# Patient Record
Sex: Female | Born: 1980 | Hispanic: Yes | Marital: Married | Smoking: Never smoker
Health system: Southern US, Community
[De-identification: ages and names within clinical notes are randomized; demographics above are authoritative.]

## PROBLEM LIST (undated history)

## (undated) HISTORY — PX: APPENDECTOMY: SHX54

---

## 2005-09-13 ENCOUNTER — Observation Stay: Payer: Self-pay | Admitting: Obstetrics & Gynecology

## 2006-10-12 ENCOUNTER — Emergency Department: Payer: Self-pay | Admitting: Emergency Medicine

## 2007-07-21 ENCOUNTER — Emergency Department: Payer: Self-pay | Admitting: Emergency Medicine

## 2008-09-03 ENCOUNTER — Emergency Department: Payer: Self-pay | Admitting: Internal Medicine

## 2010-06-14 ENCOUNTER — Emergency Department: Payer: Self-pay | Admitting: Internal Medicine

## 2011-03-13 ENCOUNTER — Emergency Department: Payer: Self-pay | Admitting: Emergency Medicine

## 2011-03-13 LAB — URINALYSIS, COMPLETE
Bacteria: NONE SEEN
Bilirubin,UR: NEGATIVE
Glucose,UR: NEGATIVE mg/dL (ref 0–75)
Nitrite: NEGATIVE
Ph: 6 (ref 4.5–8.0)
Specific Gravity: 1.003 (ref 1.003–1.030)
Squamous Epithelial: <1

## 2011-03-13 LAB — PREGNANCY, URINE: Pregnancy Test, Urine: NEGATIVE m[IU]/mL

## 2013-11-30 ENCOUNTER — Encounter: Payer: Self-pay | Admitting: Maternal & Fetal Medicine

## 2014-05-29 ENCOUNTER — Observation Stay
Admit: 2014-05-29 | Disposition: A | Discharge status: Home / Self Care | Payer: Self-pay | Attending: Obstetrics and Gynecology | Admitting: Obstetrics and Gynecology

## 2014-05-30 ENCOUNTER — Observation Stay
Admit: 2014-05-30 | Disposition: A | Discharge status: Home / Self Care | Payer: Self-pay | Attending: Obstetrics and Gynecology | Admitting: Obstetrics and Gynecology

## 2014-12-12 ENCOUNTER — Emergency Department
Admission: EM | Admit: 2014-12-12 | Discharge: 2014-12-12 | Disposition: A | Discharge status: Home / Self Care | Payer: Self-pay | Attending: Emergency Medicine | Admitting: Emergency Medicine

## 2014-12-12 ENCOUNTER — Emergency Department: Payer: Self-pay

## 2014-12-12 ENCOUNTER — Encounter: Payer: Self-pay | Admitting: *Deleted

## 2014-12-12 DIAGNOSIS — Z3202 Encounter for pregnancy test, result negative: Secondary | ICD-10-CM | POA: Insufficient documentation

## 2014-12-12 DIAGNOSIS — M5416 Radiculopathy, lumbar region: Secondary | ICD-10-CM | POA: Insufficient documentation

## 2014-12-12 DIAGNOSIS — M5410 Radiculopathy, site unspecified: Secondary | ICD-10-CM

## 2014-12-12 LAB — URINALYSIS COMPLETE WITH MICROSCOPIC (ARMC ONLY)
BACTERIA UA: NONE SEEN
Bilirubin Urine: NEGATIVE
Glucose, UA: NEGATIVE mg/dL
KETONES UR: NEGATIVE mg/dL
Leukocytes, UA: NEGATIVE
Nitrite: NEGATIVE
PH: 6 (ref 5.0–8.0)
PROTEIN: NEGATIVE mg/dL
RBC / HPF: NONE SEEN RBC/hpf (ref 0–5)
Specific Gravity, Urine: 1.004 — ABNORMAL LOW (ref 1.005–1.030)
Squamous Epithelial / LPF: NONE SEEN

## 2014-12-12 LAB — POCT PREGNANCY, URINE: PREG TEST UR: NEGATIVE

## 2014-12-12 MED ORDER — KETOROLAC TROMETHAMINE 30 MG/ML IJ SOLN
INTRAMUSCULAR | Status: AC
  Filled 2014-12-12: qty 1

## 2014-12-12 MED ORDER — TRAMADOL HCL 50 MG PO TABS: 50.0000 mg | ORAL_TABLET | Freq: Four times a day (QID) | ORAL | Status: AC | PRN

## 2014-12-12 MED ORDER — KETOROLAC TROMETHAMINE 30 MG/ML IJ SOLN
60.0000 mg | Freq: Once | INTRAMUSCULAR | Status: AC
  Administered 2014-12-12: 60 mg via INTRAMUSCULAR
  Filled 2014-12-12: qty 2

## 2014-12-12 MED ORDER — MELOXICAM 15 MG PO TABS: 15.0000 mg | ORAL_TABLET | Freq: Every day | ORAL | Status: DC

## 2014-12-12 NOTE — ED Provider Notes (Signed)
[facility] Emergency Department Provider Note  ____________________________________________  Time seen: Approximately 7:50 AM  I have reviewed the triage vital signs and the nursing notes.   HISTORY  Chief Complaint Back Pain    HPI Gabriela Carter is a 34 y.o. female presents to the emergency department complaining of low back pain. She states that she has had a history of 6 months with a back pain status post giving birth. She states that the epidural was placed incorrectly a first time and had to be replaced and since then she has had some chronic pain. She states over the last 24 hours the pain has significantly increased. Its midline. A chart. It radiates down her left leg. She denies any numbness or tingling or loss of function in lower leg. She denies any saddle anesthesia or bowel or bladder dysfunction.   History reviewed. No pertinent past medical history.  There are no active problems to display for this patient.   History reviewed. No pertinent past surgical history.  Current Outpatient Rx  Name  Route  Sig  Dispense  Refill  . meloxicam (MOBIC) 15 MG tablet   Oral   Take 1 tablet (15 mg total) by mouth daily.   30 tablet   0   . traMADol (ULTRAM) 50 MG tablet   Oral   Take 1 tablet (50 mg total) by mouth every 6 (six) hours as needed.   20 tablet   0     Allergies Percocet  History reviewed. No pertinent family history.  Social History Social History  Substance Use Topics  . Smoking status: Never Smoker   . Smokeless tobacco: None  . Alcohol Use: None    Review of Systems Constitutional: No fever/chills Eyes: No visual changes. ENT: No sore throat. Cardiovascular: Denies chest pain. Respiratory: Denies shortness of breath. Gastrointestinal: No abdominal pain.  No nausea, no vomiting.  No diarrhea.  No constipation. Genitourinary: Negative for dysuria. Musculoskeletal: Endorses lumbar back pain. Skin: Negative for  rash. Neurological: Negative for headaches, focal weakness or numbness.  10-point ROS otherwise negative.  ____________________________________________   PHYSICAL EXAM:  VITAL SIGNS: ED Triage Vitals  Enc Vitals Group     BP 12/12/14 0740 118/75 mmHg     Pulse Rate 12/12/14 0740 74     Resp 12/12/14 0740 18     Temp 12/12/14 0740 98.6 F (37 C)     Temp src --      SpO2 12/12/14 0740 98 %     Weight 12/12/14 0740 143 lb 4.8 oz (65 kg)     Height 12/12/14 0740 5\' 2"  (1.575 m)     Head Cir --      Peak Flow --      Pain Score 12/12/14 0747 10     Pain Loc --      Pain Edu? --      Excl. in GC? --     Constitutional: Alert and oriented. Well appearing and in no acute distress. Eyes: Conjunctivae are normal. PERRL. EOMI. Head: Atraumatic. Nose: No congestion/rhinnorhea. Mouth/Throat: Mucous membranes are moist.  Oropharynx non-erythematous. Neck: No stridor.  No cervical spine tenderness to palpation. Cardiovascular: Normal rate, regular rhythm. Grossly normal heart sounds.  Good peripheral circulation. Respiratory: Normal respiratory effort.  No retractions. Lungs CTAB. Gastrointestinal: Soft and nontender. No distention. No abdominal bruits. No CVA tenderness. Musculoskeletal: No lower extremity tenderness nor edema.  No joint effusions. No visible deformity or scoliosis to the lower back.  Patient is tender midline to palpation. No muscle spasms are noted in the paraspinal muscles. No tenderness to palpation over the paraspinal muscles. Positive straight leg raise left side. Neurologic:  Normal speech and language. No gross focal neurologic deficits are appreciated. No gait instability. Skin:  Skin is warm, dry and intact. No rash noted. Psychiatric: Mood and affect are normal. Speech and behavior are normal.  ____________________________________________   LABS (all labs ordered are listed, but only abnormal results are displayed)  Labs Reviewed  URINALYSIS  COMPLETEWITH MICROSCOPIC ([facility] ONLY) - Abnormal; Notable for the following:    Color, Urine STRAW (*)    APPearance CLEAR (*)    Specific Gravity, Urine 1.004 (*)    Hgb urine dipstick 1+ (*)    All other components within normal limits  POCT PREGNANCY, URINE   ____________________________________________  EKG   ____________________________________________  RADIOLOGY  Lumbar spine Impression: No acute bony abnormality ____________________________________________   PROCEDURES  Procedure(s) performed: None  Critical Care performed: No  ____________________________________________   INITIAL IMPRESSION / ASSESSMENT AND PLAN / ED COURSE  Pertinent labs & imaging results that were available during my care of the patient were reviewed by me and considered in my medical decision making (see chart for details).  The patient's history, symptoms, physical exam, and radiological findings were taken into consideration for diagnosis. Diagnosis is most consistent at this time with radicular pain/sciatica. Advised the patient and her husband of findings. Patient is to follow-up with orthopedics for further evaluation and treatment. Patient will be placed on meloxicam for symptomatic control. Patient verbalizes understanding of treatment plan and verbalizes compliance with same. ____________________________________________   FINAL CLINICAL IMPRESSION(S) / ED DIAGNOSES  Final diagnoses:  Radicular low back pain      Racheal Patches, PA-C 12/12/14 1610  Sharyn Creamer, MD 12/12/14 1600

## 2014-12-12 NOTE — Discharge Instructions (Signed)
Dolor Radicular (Radicular Pain) El dolor radicular est causado en la irritacin de las races nerviosas. Generalmente la causa es una degeneracin el un disco de la columna vertebral. Esto produce dolor que se siente en el brazo o la pierna. Entre otras causas del dolor radicular se incluyen:  Fracturas  Enfermedades cardacas  Cncer  Neuropatas (un estado anormal y a menudo degenerativo del sistema nervioso o nervios). Para el diagnstico le indicarn una tomografa computarizada o imgenes por resonancia magntica para determinar la causa primaria. Los nervios que comienzan en el cuello (races nerviosas) pueden ocasionar dolor radicular en la parte exterior del hombro y en el brazo. ste puede extenderse hacia el pulgar y los dedos. Los sntomas varan segn la raz nerviosa afectada. En la mayor parte de los casos mejora en gran medida con un tratamiento conservador. Para los problemas en el cuello le indicarn fisioterapia, un collar o una traccin cervical. El tratamiento puede durar varias semanas y se considerar la posibilidad de una ciruga si los sntomas no mejoran.  El tratamiento conservador tambin se recomienda para los casos de citica que causan dolor que se irradia desde la zona baja de la espalda o nalgas hacia la pierna o el pie. Generalmente hay una historia previa de problemas en la espalda. La mayora de los pacientes mejora completamente luego de 2 a 4 semanas de reposo en cama y otros tratamientos de apoyo. El reposo en cama reduce la presin en el disco considerablemente. El estar sentado no es una buena posicin, ya que aumenta la presin sobre el disco. Evite encorvarse, levantar mucho peso, permanecer sentado por mucho tiempo, y las actividades que puedan empeorar el problema. El los casos ms graves se realizar una traccin. La ciruga est reservada para los pacientes que no mejoran dentro de los primeros meses de tratamiento. Utilice los medicamentos de venta libre  o de prescripcin para el dolor, el malestar o la fiebre, segn se lo indique el profesional que lo asiste. Los narcticos y los relajantes musculares ayudan a calmar el dolor ms intenso y los espasmos, proporcionando una sedacin suave. Una terapia de fro o masajes tambin le brindarn alivio. No se recomienda la manipulacin de la columna vertebral. Esto puede aumentar el grado de protrusin del disco. Las inyecciones epidurales con esteroides son a menudo un tratamiento efectivo para el dolor radicular. Esas inyecciones colocan medicamentos al nervio espinal en el espacio entre la cubierta protectora de la mdula espinal y los huesos de la espalda (vertebrae). El profesional que lo asiste podr darle ms informacin acerca de las inyecciones de esteroides. Estas inyecciones son ms efectivas cuando se administran dentro de las dos semanas de la aparicin del dolor.  Comunquese con su mdico para realizar un control segn las indicaciones. Como parte importante del tratamiento deber seguir un programa de rehabilitacin, con ejercicios de elongacin y fortalecimiento.  SOLICITE ATENCIN MDICA DE INMEDIATO SI:  Presenta siente debilidad o adormecimiento inusual en sus brazos o piernas.  Presenta prdida del control del intestino o de la vejiga.  Presenta dolor abdominal.   Esta informacin no tiene como fin reemplazar el consejo del mdico. Asegrese de hacerle al mdico cualquier pregunta que tenga.   Document Released: 01/29/2005 Document Revised: 04/23/2011 Elsevier Interactive Patient Education 2016 [employer] Inc[employer]  

## 2014-12-12 NOTE — ED Notes (Signed)
PA at bedside, pt states she gave birth 6 months ago and is having back pain since,  Pt ambulatory to room but grimicing in pain, pt states pain mostly in lower back

## 2015-02-22 ENCOUNTER — Emergency Department
Admission: EM | Admit: 2015-02-22 | Discharge: 2015-02-23 | Disposition: A | Discharge status: Home / Self Care | Payer: Self-pay | Attending: Emergency Medicine | Admitting: Emergency Medicine

## 2015-02-22 ENCOUNTER — Encounter: Payer: Self-pay | Admitting: *Deleted

## 2015-02-22 DIAGNOSIS — M2550 Pain in unspecified joint: Secondary | ICD-10-CM | POA: Insufficient documentation

## 2015-02-22 DIAGNOSIS — Z791 Long term (current) use of non-steroidal anti-inflammatories (NSAID): Secondary | ICD-10-CM | POA: Insufficient documentation

## 2015-02-22 DIAGNOSIS — Z79899 Other long term (current) drug therapy: Secondary | ICD-10-CM | POA: Insufficient documentation

## 2015-02-22 DIAGNOSIS — J101 Influenza due to other identified influenza virus with other respiratory manifestations: Secondary | ICD-10-CM | POA: Insufficient documentation

## 2015-02-22 LAB — RAPID INFLUENZA A&B ANTIGENS (ARMC ONLY)
INFLUENZA A (ARMC): NEGATIVE
INFLUENZA B (ARMC): POSITIVE

## 2015-02-22 MED ORDER — OSELTAMIVIR PHOSPHATE 75 MG PO CAPS: 75.0000 mg | ORAL_CAPSULE | Freq: Two times a day (BID) | ORAL | Status: DC

## 2015-02-22 NOTE — ED Notes (Signed)
Pt reports fever and chills for 2 days.  Body aches and cough for 1 day.

## 2015-02-22 NOTE — ED Provider Notes (Signed)
[facility] Emergency Department Provider Note  ____________________________________________  Time seen: Approximately 10:46 PM  I have reviewed the triage vital signs and the nursing notes.   HISTORY  Chief Complaint Influenza    HPI Gabriela Carter is a 35 y.o. female who presents to emergency department complaining of fevers and chills 2 days. She is also endorsing myalgias and arthralgias. She states that symptoms began rapidly. Patient has been taking Tylenol, ibuprofen, DayQuil and NyQuil with minimal relief. Patient denies nasal congestion, sore throat, ear pain, chest pain, shortness of breath, abdominal pain, nausea or vomiting, diarrhea or constipation.   No past medical history on file.  There are no active problems to display for this patient.   No past surgical history on file.  Current Outpatient Rx  Name  Route  Sig  Dispense  Refill  . meloxicam (MOBIC) 15 MG tablet   Oral   Take 1 tablet (15 mg total) by mouth daily.   30 tablet   0   . oseltamivir (TAMIFLU) 75 MG capsule   Oral   Take 1 capsule (75 mg total) by mouth 2 (two) times daily.   14 capsule   0   . traMADol (ULTRAM) 50 MG tablet   Oral   Take 1 tablet (50 mg total) by mouth every 6 (six) hours as needed.   20 tablet   0     Allergies Percocet  No family history on file.  Social History Social History  Substance Use Topics  . Smoking status: Never Smoker   . Smokeless tobacco: None  . Alcohol Use: No     Review of Systems  Constitutional: Endorses fever/chills Eyes: No visual changes. No discharge ENT: No sore throat. Cardiovascular: no chest pain. Respiratory: no cough. No SOB. Gastrointestinal: No abdominal pain.  No nausea, no vomiting.  No diarrhea.  No constipation. Genitourinary: Negative for dysuria. No hematuria Musculoskeletal: Negative for back pain. Endorses myalgias and arthralgias. Skin: Negative for rash. Neurological: Negative for  headaches, focal weakness or numbness. 10-point ROS otherwise negative.  ____________________________________________   PHYSICAL EXAM:  VITAL SIGNS: ED Triage Vitals  Enc Vitals Group     BP 02/22/15 2230 108/70 mmHg     Pulse Rate 02/22/15 2230 100     Resp 02/22/15 2230 20     Temp 02/22/15 2230 99.9 F (37.7 C)     Temp Source 02/22/15 2230 Oral     SpO2 02/22/15 2230 99 %     Weight 02/22/15 2230 114 lb (51.71 kg)     Height 02/22/15 2230 5\' 2"  (1.575 m)     Head Cir --      Peak Flow --      Pain Score 02/22/15 2232 9     Pain Loc --      Pain Edu? --      Excl. in GC? --      Constitutional: Alert and oriented. Well appearing and in no acute distress. Eyes: Conjunctivae are normal. PERRL. EOMI. Head: Atraumatic. ENT:      Ears: EACs and TMs are unremarkable bilaterally.      Nose: No congestion/rhinnorhea.      Mouth/Throat: Mucous membranes are moist.  Neck: No stridor.   Hematological/Lymphatic/Immunilogical: No cervical lymphadenopathy. Cardiovascular: Normal rate, regular rhythm. Normal S1 and S2.  Good peripheral circulation. Respiratory: Normal respiratory effort without tachypnea or retractions. Lungs CTAB. Gastrointestinal: Soft and nontender. No distention. No CVA tenderness. Musculoskeletal: No lower extremity tenderness nor  edema.  No joint effusions. Neurologic:  Normal speech and language. No gross focal neurologic deficits are appreciated.  Skin:  Skin is warm, dry and intact. No rash noted. Psychiatric: Mood and affect are normal. Speech and behavior are normal. Patient exhibits appropriate insight and judgement.   ____________________________________________   LABS (all labs ordered are listed, but only abnormal results are displayed)  Labs Reviewed  RAPID INFLUENZA A&B ANTIGENS ([facility] ONLY)   ____________________________________________  EKG   ____________________________________________  RADIOLOGY No results  found.  ____________________________________________    PROCEDURES  Procedure(s) performed:       Medications - No data to display   ____________________________________________   INITIAL IMPRESSION / ASSESSMENT AND PLAN / ED COURSE  Pertinent labs & imaging results that were available during my care of the patient were reviewed by me and considered in my medical decision making (see chart for details).  Patient's diagnosis is consistent with influenza B. Patient will be discharged home with prescriptions for Tamiflu. She may take Tylenol and ibuprofen at home for additional symptom control. Patient is to follow up with [facility] clinic if symptoms persist past this treatment coarse. Patient is given ED precautions to return to the ED for any worsening or new symptoms.     ____________________________________________  FINAL CLINICAL IMPRESSION(S) / ED DIAGNOSES  Final diagnoses:  Influenza B      NEW MEDICATIONS STARTED DURING THIS VISIT:  Discharge Medication List as of 02/22/2015 11:52 PM    START taking these medications   Details  oseltamivir (TAMIFLU) 75 MG capsule Take 1 capsule (75 mg total) by mouth 2 (two) times daily., Starting 02/22/2015, Until Discontinued, Print            Delorise Royals Kieffer Blatz, PA-C 02/23/15 0037  Sharman Cheek, MD 02/24/15 1537

## 2015-02-22 NOTE — Discharge Instructions (Signed)
Gripe - Adultos  (Influenza, Adult)  La gripe es una infección viral del tracto respiratorio. Ocurre con más frecuencia en los meses de invierno, ya que las personas pasan más tiempo en contacto cercano. La gripe puede enfermarlo considerablemente. Se transmite fácilmente de una persona a otra (es contagiosa).  CAUSAS   La causa es un virus que infecta el tracto respiratorio. Puede contagiarse el virus al aspirar las gotitas que una persona infectada elimina al toser o estornudar. También puede contagiarse al tocar algo que fue recientemente contaminado con el virus y luego llevarse la mano a la boca, la nariz o los ojos.  RIESGOS Y COMPLICACIONES  Tendrá mayor riesgo de sufrir un resfrío grave si consume cigarrillos, es diabético, sufre una enfermedad cardíaca (como insuficiencia cardíaca) o pulmonar crónica (como asma) o si tiene debilitado el sistema inmunológico. Los ancianos y las mujeres embarazadas tienen más riesgo de sufrir infecciones graves. El problema más frecuente de la gripe es la infección pulmonar (neumonía). En algunos casos, este problema puede requerir atención médica de emergencia y poner en peligro la vida.  SIGNOS Y SÍNTOMAS   Los síntomas pueden durar entre 4 y 10 días y pueden ser:  · Fiebre.  · Escalofríos.  · Dolor de cabeza, dolores en el cuerpo y musculares.  · Dolor de garganta.  · Molestias en el pecho y tos.  · Pérdida del apetito.  · Debilidad o cansancio.  · Mareos.  · Náuseas o vómitos.  DIAGNÓSTICO   El diagnóstico se realiza según su historia clínica y un examen físico. Es necesario realizar un análisis de cultivo faríngeo o nasal para confirmar el diagnóstico.  TRATAMIENTO   En los casos leves, la gripe se cura sin tratamiento. El tratamiento está dirigido a aliviar los síntomas. En los casos más graves, el médico podrá recetar medicamentos antivirales para acortar el curso de la enfermedad. Los antibióticos no son eficaces, ya que la infección está causada por un virus y no una  bacteria.  INSTRUCCIONES PARA EL CUIDADO EN EL HOGAR   · Tome los medicamentos solamente como se lo haya indicado el médico.  · Utilice un humidificador de niebla fría para facilitar la respiración.  · Haga reposo hasta que la temperatura vuelva a ser normal. Generalmente esto lleva entre 3 y 4 días.  · Beba suficiente líquido para mantener la orina clara o de color amarillo pálido.  · Cúbrase la boca y la nariz al toser o estornudar, y lávese las manos muy bien para evitar que se propague el virus.  · Quédese en su casa y no concurra al trabajo o a la escuela hasta que la fiebre haya desaparecido al menos por un día completo.  PREVENCIÓN   La vacunación anual contra la gripe es la mejor manera de evitar enfermarse. Se recomienda ahora de manera rutinaria una vacuna anual contra la gripe a todos los adultos estadounidenses.  SOLICITE ATENCIÓN MÉDICA SI:  · Tiene dolor en el pecho, la tos empeora o tiene más mucosidad.  · Tiene náuseas, vómitos o diarrea.  · La fiebre regresa o empeora.  SOLICITE ATENCIÓN MÉDICA DE INMEDIATO SI:   · Tiene dificultad para respirar, le falta el aire o tiene la piel o las uñas azuladas.  · Presenta dolor intenso o entumecimiento en el cuello.  · Le duele la cabeza de forma repentina o tiene dolor en la cara o el oído.  · Tiene náuseas o vómitos que no puede controlar.  ASEGÚRESE DE QUE:   · Comprende   estas instrucciones.  · Controlará su afección.  · Recibirá ayuda de inmediato si no mejora o si empeora.     Esta información no tiene como fin reemplazar el consejo del médico. Asegúrese de hacerle al médico cualquier pregunta que tenga.     Document Released: 11/08/2004 Document Revised: 02/19/2014  Elsevier Interactive Patient Education ©2016 [employer] Inc.

## 2017-07-24 ENCOUNTER — Emergency Department: Payer: Self-pay

## 2017-07-24 ENCOUNTER — Emergency Department
Admission: EM | Admit: 2017-07-24 | Discharge: 2017-07-24 | Disposition: A | Discharge status: Home / Self Care | Payer: Self-pay | Attending: Emergency Medicine | Admitting: Emergency Medicine

## 2017-07-24 ENCOUNTER — Other Ambulatory Visit: Payer: Self-pay

## 2017-07-24 DIAGNOSIS — Z79899 Other long term (current) drug therapy: Secondary | ICD-10-CM | POA: Insufficient documentation

## 2017-07-24 DIAGNOSIS — R197 Diarrhea, unspecified: Secondary | ICD-10-CM

## 2017-07-24 DIAGNOSIS — R112 Nausea with vomiting, unspecified: Secondary | ICD-10-CM | POA: Insufficient documentation

## 2017-07-24 DIAGNOSIS — R1013 Epigastric pain: Secondary | ICD-10-CM | POA: Insufficient documentation

## 2017-07-24 DIAGNOSIS — N390 Urinary tract infection, site not specified: Secondary | ICD-10-CM | POA: Insufficient documentation

## 2017-07-24 LAB — URINALYSIS, COMPLETE (UACMP) WITH MICROSCOPIC
BILIRUBIN URINE: NEGATIVE
Glucose, UA: NEGATIVE mg/dL
KETONES UR: NEGATIVE mg/dL
NITRITE: NEGATIVE
PROTEIN: NEGATIVE mg/dL
Specific Gravity, Urine: 1.01 (ref 1.005–1.030)
pH: 5 (ref 5.0–8.0)

## 2017-07-24 LAB — CBC
HCT: 36.5 % (ref 35.0–47.0)
HEMOGLOBIN: 12.2 g/dL (ref 12.0–16.0)
MCH: 27.7 pg (ref 26.0–34.0)
MCHC: 33.5 g/dL (ref 32.0–36.0)
MCV: 82.5 fL (ref 80.0–100.0)
Platelets: 271 10*3/uL (ref 150–440)
RBC: 4.42 MIL/uL (ref 3.80–5.20)
RDW: 16.1 % — ABNORMAL HIGH (ref 11.5–14.5)
WBC: 10.3 10*3/uL (ref 3.6–11.0)

## 2017-07-24 LAB — COMPREHENSIVE METABOLIC PANEL
ALBUMIN: 4 g/dL (ref 3.5–5.0)
ALK PHOS: 79 U/L (ref 38–126)
ALT: 29 U/L (ref 14–54)
AST: 36 U/L (ref 15–41)
Anion gap: 8 (ref 5–15)
BUN: 12 mg/dL (ref 6–20)
CALCIUM: 8.7 mg/dL — AB (ref 8.9–10.3)
CHLORIDE: 108 mmol/L (ref 101–111)
CO2: 22 mmol/L (ref 22–32)
Creatinine, Ser: 0.8 mg/dL (ref 0.44–1.00)
GFR calc Af Amer: >60 mL/min (ref >60)
GFR calc non Af Amer: >60 mL/min (ref >60)
GLUCOSE: 103 mg/dL — AB (ref 65–99)
POTASSIUM: 3.7 mmol/L (ref 3.5–5.1)
SODIUM: 138 mmol/L (ref 135–145)
Total Bilirubin: 0.5 mg/dL (ref 0.3–1.2)
Total Protein: 7.6 g/dL (ref 6.5–8.1)

## 2017-07-24 LAB — POCT PREGNANCY, URINE: Preg Test, Ur: NEGATIVE

## 2017-07-24 LAB — LIPASE, BLOOD: Lipase: 39 U/L (ref 11–51)

## 2017-07-24 MED ORDER — DICYCLOMINE HCL 20 MG PO TABS: 20.0000 mg | ORAL_TABLET | Freq: Four times a day (QID) | ORAL | 0 refills | Status: DC | PRN

## 2017-07-24 MED ORDER — ONDANSETRON 4 MG PO TBDP: 4.0000 mg | ORAL_TABLET | Freq: Three times a day (TID) | ORAL | 0 refills | Status: DC | PRN

## 2017-07-24 MED ORDER — DEXTROSE-NACL 5-0.45 % IV SOLN
INTRAVENOUS | Status: DC
Start: 2017-07-24 — End: 2017-07-24
  Administered 2017-07-24: 04:00:00 via INTRAVENOUS

## 2017-07-24 MED ORDER — FENTANYL CITRATE (PF) 100 MCG/2ML IJ SOLN
25.0000 ug | Freq: Once | INTRAMUSCULAR | Status: AC
  Administered 2017-07-24: 25 ug via INTRAVENOUS
  Filled 2017-07-24: qty 2

## 2017-07-24 MED ORDER — ONDANSETRON HCL 4 MG/2ML IJ SOLN
4.0000 mg | Freq: Once | INTRAMUSCULAR | Status: AC
  Administered 2017-07-24: 4 mg via INTRAVENOUS
  Filled 2017-07-24: qty 2

## 2017-07-24 MED ORDER — FOSFOMYCIN TROMETHAMINE 3 G PO PACK
3.0000 g | PACK | Freq: Once | ORAL | Status: AC
  Administered 2017-07-24: 3 g via ORAL
  Filled 2017-07-24: qty 3

## 2017-07-24 MED ORDER — SODIUM CHLORIDE 0.9 % IV BOLUS
1000.0000 mL | Freq: Once | INTRAVENOUS | Status: AC
  Administered 2017-07-24: 1000 mL via INTRAVENOUS

## 2017-07-24 MED ORDER — FAMOTIDINE IN NACL 20-0.9 MG/50ML-% IV SOLN
20.0000 mg | Freq: Once | INTRAVENOUS | Status: AC
  Administered 2017-07-24: 20 mg via INTRAVENOUS
  Filled 2017-07-24: qty 50

## 2017-07-24 NOTE — ED Notes (Signed)

## 2017-07-24 NOTE — ED Provider Notes (Signed)
[facility] Emergency Department Provider Note   ____________________________________________   First MD Initiated Contact with Patient 07/24/17 [id number]     (approximate)  I have reviewed the triage vital signs and the nursing notes.   HISTORY  Chief Complaint Emesis  History obtained Via [employer] Spanish interpreter  HPI Gabriela Carter is a 37 y.o. female who presents to the ED from home with a chief complaint of abdominal pain, nausea, vomiting and diarrhea.  Patient returned from [place] last week and began having symptoms on 6/5.  Diarrhea resolved on 6/9.  Vomiting was improving early this week.  She was feeling better and went to the gym last evening when she experienced upper abdominal pain and vomiting.  Denies associated fever, chills, chest pain, shortness of breath, dysuria.  Denies recent antibiotic use.   Past medical history None  There are no active problems to display for this patient.   Past surgical history Appendectomy Breast augmentation Right elbow surgery  Prior to Admission medications   Medication Sig Start Date End Date Taking? Authorizing Provider  meloxicam (MOBIC) 15 MG tablet Take 1 tablet (15 mg total) by mouth daily. 12/12/14   Cuthriell, Delorise RoyalsJonathan D, PA-C  oseltamivir (TAMIFLU) 75 MG capsule Take 1 capsule (75 mg total) by mouth 2 (two) times daily. 02/22/15   Cuthriell, Delorise RoyalsJonathan D, PA-C    Allergies Percocet [oxycodone-acetaminophen]  No family history on file.  Social History Social History   Tobacco Use  . Smoking status: Never Smoker  Substance Use Topics  . Alcohol use: No  . Drug use: Not on file    Review of Systems  Constitutional: No fever/chills Eyes: No visual changes. ENT: No sore throat. Cardiovascular: Denies chest pain. Respiratory: Denies shortness of breath. Gastrointestinal: Positive for abdominal pain, nausea, vomiting and diarrhea.  No constipation. Genitourinary: Negative for  dysuria. Musculoskeletal: Negative for back pain. Skin: Negative for rash. Neurological: Negative for headaches, focal weakness or numbness.   ____________________________________________   PHYSICAL EXAM:  VITAL SIGNS: ED Triage Vitals  Enc Vitals Group     BP 07/24/17 0323 126/82     Pulse Rate 07/24/17 0323 84     Resp 07/24/17 0323 20     Temp 07/24/17 0323 98.4 F (36.9 C)     Temp Source 07/24/17 0323 Oral     SpO2 07/24/17 0323 98 %     Weight 07/24/17 0324 147 lb (66.7 kg)     Height --      Head Circumference --      Peak Flow --      Pain Score 07/24/17 0324 9     Pain Loc --      Pain Edu? --      Excl. in GC? --     Constitutional: Alert and oriented. Well appearing and in mild acute distress. Eyes: Conjunctivae are normal. PERRL. EOMI. Head: Atraumatic. Nose: No congestion/rhinnorhea. Mouth/Throat: Mucous membranes are mildly dry.  Oropharynx non-erythematous. Neck: No stridor.   Cardiovascular: Normal rate, regular rhythm. Grossly normal heart sounds.  Good peripheral circulation. Respiratory: Normal respiratory effort.  No retractions. Lungs CTAB. Gastrointestinal: Soft and mildly tender to palpation epigastrium without rebound or guarding. No distention. No abdominal bruits. No CVA tenderness. Musculoskeletal: No lower extremity tenderness nor edema.  No joint effusions. Neurologic:  Normal speech and language. No gross focal neurologic deficits are appreciated. No gait instability. Skin:  Skin is warm, dry and intact. No rash noted. Psychiatric: Mood and affect are  normal. Speech and behavior are normal.  ____________________________________________   LABS (all labs ordered are listed, but only abnormal results are displayed)  Labs Reviewed  CBC - Abnormal; Notable for the following components:      Result Value   RDW 16.1 (*)    All other components within normal limits  COMPREHENSIVE METABOLIC PANEL - Abnormal; Notable for the following  components:   Glucose, Bld 103 (*)    Calcium 8.7 (*)    All other components within normal limits  URINALYSIS, COMPLETE (UACMP) WITH MICROSCOPIC - Abnormal; Notable for the following components:   Color, Urine YELLOW (*)    APPearance HAZY (*)    Hgb urine dipstick SMALL (*)    Leukocytes, UA TRACE (*)    Bacteria, UA RARE (*)    All other components within normal limits  LIPASE, BLOOD  POC URINE PREG, ED  POCT PREGNANCY, URINE   ____________________________________________  EKG  None ____________________________________________  RADIOLOGY  ED MD interpretation: Unremarkable ultrasound  Official radiology report(s): US Abdomen Limited Ruq  Result Date: 07/24/2017 CLINICAL DATA:  Epigastric pain with nausea and vomiting. EXAM: ULTRASOUND ABDOMEN LIMITED RIGHT UPPER QUADRANT COMPARISON:  None. FINDINGS: Gallbladder: Physiologically distended. Small echogenic foci adherent to the gallbladder wall, largest measuring 4 mm, may be gallbladder polyps or adenomyomatosis. No gallstones or wall thickening visualized. No sonographic Murphy sign noted by sonographer. Common bile duct: Diameter: 2 mm, normal. Liver: No focal lesion identified. Within normal limits in parenchymal echogenicity. Portal vein is patent on color Doppler imaging with normal direction of blood flow towards the liver. Dilatation of the right renal pelvis, similar to abdominal CT 07/22/2007 no urolithiasis visualize sonographically. IMPRESSION: 1. No gallstones or gallbladder wall thickening. Small gallbladder polyps largest measuring 4 mm versus less likely adenomyomatosis. No dedicated further imaging is needed given size. 2. Normal sonographic appearance of the liver and biliary tree. 3. Incidental finding of dilatation of the right renal collecting system, grossly unchanged from abdominal CT 07/22/2007 and may represent UPJ obstruction. Electronically Signed   By: Rubye Oaks M.D.   On: 07/24/2017 05:26     ____________________________________________   PROCEDURES  Procedure(s) performed: None  Procedures  Critical Care performed: No  ____________________________________________   INITIAL IMPRESSION / ASSESSMENT AND PLAN / ED COURSE  As part of my medical decision making, I reviewed the following data within the electronic MEDICAL RECORD NUMBER Nursing notes reviewed and incorporated, Interpreter needed, Labs reviewed, Old chart reviewed, Radiograph reviewed and Notes from prior ED visits   36 year old female who returned from [place] last week and presents with upper abdominal pain, nausea and vomiting.  Diarrhea last week which has resolved. Differential diagnosis includes, but is not limited to, biliary disease (biliary colic, acute cholecystitis, cholangitis, choledocholithiasis, etc), intrathoracic causes for epigastric abdominal pain including ACS, gastritis, duodenitis, pancreatitis, small bowel or large bowel obstruction, abdominal aortic aneurysm, hernia, and ulcer(s).  Will obtain screening lab work including LFTs and lipase.  Initiate IV fluid resuscitation.  Will administer IV fentanyl and Zofran for pain/antiemetic, IV Pepcid for pain and proceed with upper abdominal ultrasound to evaluate for cholecystitis.   Clinical Course as of Jul 24 656  Wed Jul 24, 2017  0654 Patient tolerated ice chips without emesis.  Overall feeling significantly better.  Single dose fosfomycin will be given prior to discharge.  Will discharge home with Zofran prescription to use as needed.  Strict return precautions given.  Patient and family member verbalize understanding and agree with plan  of care.   [JS]    Clinical Course User Index [JS] Irean Hong, MD     ____________________________________________   FINAL CLINICAL IMPRESSION(S) / ED DIAGNOSES  Final diagnoses:  Epigastric pain  Nausea vomiting and diarrhea  Lower urinary tract infectious disease     ED Discharge Orders     None       Note:  This document was prepared using Dragon voice recognition software and may include unintentional dictation errors.    Irean Hong, MD 07/24/17 [phone]

## 2017-07-24 NOTE — Discharge Instructions (Addendum)
You may take pain and nausea medicine as needed         (Bentyl/Zofran #20). Drink clear liquids for the next 12 hours, then bland diet for the next 5 days, then slowly advance diet as tolerated. Return to the ER for worsening symptoms, persistent vomiting, difficulty breathing or other concerns.

## 2017-07-24 NOTE — ED Notes (Signed)
Pt reports nausea/vomiting and diarrhea that started last week. Pt reports vacationing out of country recently as well. Pt alert and oriented x 4. Family at bedside.

## 2017-07-24 NOTE — ED Triage Notes (Signed)
Pt in with co upper abd pain and vomiting that started tonight. Pt was in [place] last week and started with diarrhea when she arrived.

## 2018-03-31 ENCOUNTER — Emergency Department: Payer: Self-pay

## 2018-03-31 ENCOUNTER — Other Ambulatory Visit: Payer: Self-pay

## 2018-03-31 ENCOUNTER — Emergency Department
Admission: EM | Admit: 2018-03-31 | Discharge: 2018-03-31 | Disposition: A | Discharge status: Home / Self Care | Payer: Self-pay | Attending: Student in an Organized Health Care Education/Training Program | Admitting: Student in an Organized Health Care Education/Training Program

## 2018-03-31 ENCOUNTER — Encounter: Payer: Self-pay | Admitting: Emergency Medicine

## 2018-03-31 DIAGNOSIS — J40 Bronchitis, not specified as acute or chronic: Secondary | ICD-10-CM | POA: Insufficient documentation

## 2018-03-31 LAB — POCT PREGNANCY, URINE: PREG TEST UR: NEGATIVE

## 2018-03-31 MED ORDER — PREDNISONE 10 MG PO TABS: ORAL_TABLET | ORAL | 0 refills | Status: DC

## 2018-03-31 MED ORDER — AZITHROMYCIN 500 MG PO TABS
500.0000 mg | ORAL_TABLET | Freq: Once | ORAL | Status: AC
  Administered 2018-03-31: 500 mg via ORAL
  Filled 2018-03-31: qty 1

## 2018-03-31 MED ORDER — BENZONATATE 100 MG PO CAPS: 100.0000 mg | ORAL_CAPSULE | Freq: Three times a day (TID) | ORAL | 0 refills | Status: DC | PRN

## 2018-03-31 MED ORDER — PREDNISONE 20 MG PO TABS
60.0000 mg | ORAL_TABLET | Freq: Once | ORAL | Status: AC
  Administered 2018-03-31: 60 mg via ORAL
  Filled 2018-03-31: qty 3

## 2018-03-31 MED ORDER — AZITHROMYCIN 250 MG PO TABS: ORAL_TABLET | ORAL | 0 refills | Status: DC

## 2018-03-31 NOTE — ED Notes (Signed)
Negative urine preg  Lm edt

## 2018-03-31 NOTE — ED Triage Notes (Signed)
Pt presents to ED via POV with c/o cough. Pt denies productive cough at this time. Pt with dry cough noted in triage.

## 2018-03-31 NOTE — ED Notes (Signed)
Reference triage note. Pt c/o cough with no relief with OTC medications. Pt denies fevers at home. Pt appears tired while speaking to this RN. Pt states fatigue has been getting worse the past couple of days.

## 2018-03-31 NOTE — ED Provider Notes (Signed)
[facility] Emergency Department Provider Note  ____________________________________________  Time seen: Approximately 10:09 PM  I have reviewed the triage vital signs and the nursing notes.   HISTORY  Chief Complaint Cough    HPI Gabriela Carter is a 38 y.o. female that presents emergency department for evaluation of occasional productive cough for 2 weeks.  Patient is unsure of fever because she has been taking Tylenol and Motrin.  Patient states that her last menstrual period was 3 weeks ago but is not sure if she is pregnant.  Partner was sick with a URI the week prior.  No nasal congestion, nausea, vomiting, abdominal pain.   History reviewed. No pertinent past medical history.  There are no active problems to display for this patient.   History reviewed. No pertinent surgical history.  Prior to Admission medications   Medication Sig Start Date End Date Taking? Authorizing Provider  azithromycin (ZITHROMAX Z-PAK) 250 MG tablet Take 2 tablets (500 mg) on  Day 1,  followed by 1 tablet (250 mg) once daily on Days 2 through 5. 03/31/18   Enid Derry, PA-C  benzonatate (TESSALON PERLES) 100 MG capsule Take 1 capsule (100 mg total) by mouth 3 (three) times daily as needed for cough. 03/31/18 03/31/19  Enid Derry, PA-C  dicyclomine (BENTYL) 20 MG tablet Take 1 tablet (20 mg total) by mouth every 6 (six) hours as needed. 07/24/17   Irean Hong, MD  meloxicam (MOBIC) 15 MG tablet Take 1 tablet (15 mg total) by mouth daily. 12/12/14   Cuthriell, Delorise Royals, PA-C  ondansetron (ZOFRAN ODT) 4 MG disintegrating tablet Take 1 tablet (4 mg total) by mouth every 8 (eight) hours as needed for nausea or vomiting. 07/24/17   Irean Hong, MD  oseltamivir (TAMIFLU) 75 MG capsule Take 1 capsule (75 mg total) by mouth 2 (two) times daily. 02/22/15   Cuthriell, Delorise Royals, PA-C  predniSONE (DELTASONE) 10 MG tablet Take 6 tablets on day 1, take 5 tablets on day 2, take 4  tablets on day 3, take 3 tablets on day 4, take 2 tablets on day 5, take 1 tablet on day 6 03/31/18   Enid Derry, PA-C    Allergies Percocet [oxycodone-acetaminophen]  No family history on file.  Social History Social History   Tobacco Use  . Smoking status: Never Smoker  . Smokeless tobacco: Never Used  Substance Use Topics  . Alcohol use: No  . Drug use: Not on file     Review of Systems  Constitutional: No chills Eyes: No visual changes. No discharge. ENT: Positive for congestion and rhinorrhea. Cardiovascular: No chest pain. Respiratory: Positive for cough. No SOB. Gastrointestinal: No abdominal pain.  No nausea, no vomiting.  No diarrhea.  No constipation. Musculoskeletal: Negative for musculoskeletal pain. Skin: Negative for rash, abrasions, lacerations, ecchymosis. Neurological: Negative for headaches.   ____________________________________________   PHYSICAL EXAM:  VITAL SIGNS: ED Triage Vitals [03/31/18 1928]  Enc Vitals Group     BP 108/71     Pulse Rate 76     Resp 18     Temp 98.4 F (36.9 C)     Temp Source Oral     SpO2 100 %     Weight 147 lb (66.7 kg)     Height 5\' 2"  (1.575 m)     Head Circumference      Peak Flow      Pain Score 0     Pain Loc  Pain Edu?      Excl. in GC?      Constitutional: Alert and oriented. Well appearing and in no acute distress. Eyes: Conjunctivae are normal. PERRL. EOMI. No discharge. Head: Atraumatic. ENT: No frontal and maxillary sinus tenderness.      Ears: Tympanic membranes pearly gray with good landmarks. No discharge.      Nose: No congestion/rhinnorhea.      Mouth/Throat: Mucous membranes are moist. Oropharynx non-erythematous. Tonsils not enlarged. No exudates. Uvula midline. Neck: No stridor.   Hematological/Lymphatic/Immunilogical: No cervical lymphadenopathy. Cardiovascular: Normal rate, regular rhythm.  Good peripheral circulation. Respiratory: Normal respiratory effort without tachypnea  or retractions. Lungs CTAB. Good air entry to the bases with no decreased or absent breath sounds. Gastrointestinal: Bowel sounds 4 quadrants. Soft and nontender to palpation. No guarding or rigidity. No palpable masses. No distention. Musculoskeletal: Full range of motion to all extremities. No gross deformities appreciated. Neurologic:  Normal speech and language. No gross focal neurologic deficits are appreciated.  Skin:  Skin is warm, dry and intact. No rash noted. Psychiatric: Mood and affect are normal. Speech and behavior are normal. Patient exhibits appropriate insight and judgement.   ____________________________________________   LABS (all labs ordered are listed, but only abnormal results are displayed)  Labs Reviewed  POC URINE PREG, ED  POCT PREGNANCY, URINE   ____________________________________________  EKG   ____________________________________________  RADIOLOGY   Dg Chest 2 View  Result Date: 03/31/2018 CLINICAL DATA:  Cough EXAM: CHEST - 2 VIEW COMPARISON:  None. FINDINGS: The heart size and mediastinal contours are within normal limits. Both lungs are clear. The visualized skeletal structures are unremarkable. IMPRESSION: No active cardiopulmonary disease. Electronically Signed   By: Deatra Robinson M.D.   On: 03/31/2018 22:52    ____________________________________________    PROCEDURES  Procedure(s) performed:    Procedures    Medications  predniSONE (DELTASONE) tablet 60 mg (60 mg Oral Given 03/31/18 2317)  azithromycin (ZITHROMAX) tablet 500 mg (500 mg Oral Given 03/31/18 2317)     ____________________________________________   INITIAL IMPRESSION / ASSESSMENT AND PLAN / ED COURSE  Pertinent labs & imaging results that were available during my care of the patient were reviewed by me and considered in my medical decision making (see chart for details).  Review of the [place] CSRS was performed in accordance of the [employer] prior to dispensing any  controlled drugs.   Patient's diagnosis is consistent with bronchitis. Vital signs and exam are reassuring.  Chest x-ray negative for acute cardiopulmonary processes. Patient appears well and is staying well hydrated. Patient should alternate tylenol and ibuprofen for fever. Patient feels comfortable going home. Patient will be discharged home with prescriptions for prednisone, azithromycin, Tessalon Perles. Patient is to follow up with primary care as needed or otherwise directed. Patient is given ED precautions to return to the ED for any worsening or new symptoms.     ____________________________________________  FINAL CLINICAL IMPRESSION(S) / ED DIAGNOSES  Final diagnoses:  Bronchitis      NEW MEDICATIONS STARTED DURING THIS VISIT:  ED Discharge Orders         Ordered    predniSONE (DELTASONE) 10 MG tablet     03/31/18 2305    azithromycin (ZITHROMAX Z-PAK) 250 MG tablet     03/31/18 2305    benzonatate (TESSALON PERLES) 100 MG capsule  3 times daily PRN     03/31/18 2305              This chart was  dictated using voice recognition software/Dragon. Despite best efforts to proofread, errors can occur which can change the meaning. Any change was purely unintentional.    Enid DerryWagner, Latrese Carolan, PA-C 03/31/18 2334    Willy Eddyobinson, Patrick, MD 03/31/18 [phone]

## 2018-06-20 ENCOUNTER — Other Ambulatory Visit: Payer: Self-pay

## 2018-06-20 ENCOUNTER — Emergency Department
Admission: EM | Admit: 2018-06-20 | Discharge: 2018-06-20 | Disposition: A | Discharge status: Home / Self Care | Payer: Self-pay | Attending: Emergency Medicine | Admitting: Emergency Medicine

## 2018-06-20 ENCOUNTER — Encounter: Payer: Self-pay | Admitting: Physician Assistant

## 2018-06-20 DIAGNOSIS — N911 Secondary amenorrhea: Secondary | ICD-10-CM | POA: Insufficient documentation

## 2018-06-20 DIAGNOSIS — Z79899 Other long term (current) drug therapy: Secondary | ICD-10-CM | POA: Insufficient documentation

## 2018-06-20 DIAGNOSIS — N898 Other specified noninflammatory disorders of vagina: Secondary | ICD-10-CM | POA: Insufficient documentation

## 2018-06-20 LAB — COMPREHENSIVE METABOLIC PANEL
ALT: 15 U/L (ref 0–44)
AST: 18 U/L (ref 15–41)
Albumin: 4 g/dL (ref 3.5–5.0)
Alkaline Phosphatase: 76 U/L (ref 38–126)
Anion gap: 9 (ref 5–15)
BUN: 10 mg/dL (ref 6–20)
CO2: 23 mmol/L (ref 22–32)
Calcium: 8.7 mg/dL — ABNORMAL LOW (ref 8.9–10.3)
Chloride: 107 mmol/L (ref 98–111)
Creatinine, Ser: 0.56 mg/dL (ref 0.44–1.00)
GFR calc Af Amer: >60 mL/min (ref >60)
GFR calc non Af Amer: >60 mL/min (ref >60)
Glucose, Bld: 99 mg/dL (ref 70–99)
Potassium: 4 mmol/L (ref 3.5–5.1)
Sodium: 139 mmol/L (ref 135–145)
Total Bilirubin: 0.5 mg/dL (ref 0.3–1.2)
Total Protein: 7.2 g/dL (ref 6.5–8.1)

## 2018-06-20 LAB — WET PREP, GENITAL
Clue Cells Wet Prep HPF POC: NONE SEEN
Sperm: NONE SEEN
Trich, Wet Prep: NONE SEEN
Yeast Wet Prep HPF POC: NONE SEEN

## 2018-06-20 LAB — CBC
HCT: 38.6 % (ref 36.0–46.0)
Hemoglobin: 12.5 g/dL (ref 12.0–15.0)
MCH: 27 pg (ref 26.0–34.0)
MCHC: 32.4 g/dL (ref 30.0–36.0)
MCV: 83.4 fL (ref 80.0–100.0)
Platelets: 265 10*3/uL (ref 150–400)
RBC: 4.63 MIL/uL (ref 3.87–5.11)
RDW: 15.7 % — ABNORMAL HIGH (ref 11.5–15.5)
WBC: 8.7 10*3/uL (ref 4.0–10.5)
nRBC: 0 % (ref 0.0–0.2)

## 2018-06-20 LAB — URINALYSIS, COMPLETE (UACMP) WITH MICROSCOPIC
Bilirubin Urine: NEGATIVE
Glucose, UA: NEGATIVE mg/dL
Ketones, ur: NEGATIVE mg/dL
Leukocytes,Ua: NEGATIVE
Nitrite: NEGATIVE
Protein, ur: NEGATIVE mg/dL
Specific Gravity, Urine: 1.003 — ABNORMAL LOW (ref 1.005–1.030)
pH: 6 (ref 5.0–8.0)

## 2018-06-20 LAB — CHLAMYDIA/NGC RT PCR (ARMC ONLY)
Chlamydia Tr: NOT DETECTED
N gonorrhoeae: NOT DETECTED

## 2018-06-20 LAB — HCG, QUANTITATIVE, PREGNANCY: hCG, Beta Chain, Quant, S: <1 m[IU]/mL (ref <5)

## 2018-06-20 LAB — POCT PREGNANCY, URINE
Preg Test, Ur: NEGATIVE
Preg Test, Ur: NEGATIVE

## 2018-06-20 LAB — LIPASE, BLOOD: Lipase: 42 U/L (ref 11–51)

## 2018-06-20 NOTE — ED Notes (Signed)
Discharge instructions and follow up reviewed with amanda spanish intepreter.

## 2018-06-20 NOTE — Discharge Instructions (Addendum)
Su examen y sus laboratorios son normales en este momento. Debe continuar realizando pruebas mensuales de Psychiatrist en el hogar hasta que comience su perodo. Se le notificar por telfono si alguna prueba pendiente es positiva. Tome Tylenol de venta libre para Chief Technology Officer. Haga un seguimiento con el [facility] [place] [facility] [place] para una evaluacin adicional. Se necesita regresar al departamento de emergencias  Your exam and labs are normal at this time. You should continue to perform monthly home pregnancy tests until your period starts. You will be notified by phone if any pending test are positive. Take OTC Tylenol for pain. Follow-up with the [facility] Department for further evaluation. Return to the emergency department is needed.

## 2018-06-20 NOTE — ED Notes (Signed)
Information for assessment obtained with assist of amanda, [place] spanish interpreter.

## 2018-06-20 NOTE — ED Provider Notes (Signed)
[facility] Emergency Department Provider Note ____________________________________________  Time seen: 1920  I have reviewed the triage vital signs and the nursing notes.  HISTORY  Chief Complaint  Abdominal Pain  History limited by Spanish language. Interpreter Marchelle Folks(Amanda) present for interview and exam.  HPI Gabriela Carter is a 38 y.o. female says her self to the ED for evaluation of a 2-day complaint of lower abdominal pelvic cramping.  She reports nausea without vomiting, and no fevers, sweats, abnormal vaginal bleeding, or dysuria.  Patient reports her last menstrual period was 3 months prior, but she does also give a history of irregular menses.  She done a monthly home pregnancy test at home, and have all been negative in the interim.  She reports a scant yellow discharge and some burning to the vulva.  She not currently on any birth control and denies any unprotected intercourse except that in which she engages with her husband.  She denies any overt sign for STI at this time.  Patient gives a history of ovarian cysts, and has had 4 vaginal deliveries as well as 3 spontaneous miscarriages.  History reviewed. No pertinent past medical history.  There are no active problems to display for this patient.  History reviewed. No pertinent surgical history.  Prior to Admission medications   Medication Sig Start Date End Date Taking? Authorizing Provider  azithromycin (ZITHROMAX Z-PAK) 250 MG tablet Take 2 tablets (500 mg) on  Day 1,  followed by 1 tablet (250 mg) once daily on Days 2 through 5. 03/31/18   Enid DerryWagner, Ashley, PA-C  benzonatate (TESSALON PERLES) 100 MG capsule Take 1 capsule (100 mg total) by mouth 3 (three) times daily as needed for cough. 03/31/18 03/31/19  Enid DerryWagner, Ashley, PA-C  dicyclomine (BENTYL) 20 MG tablet Take 1 tablet (20 mg total) by mouth every 6 (six) hours as needed. 07/24/17   Irean HongSung, Jade J, MD  meloxicam (MOBIC) 15 MG tablet Take 1 tablet (15  mg total) by mouth daily. 12/12/14   Cuthriell, Delorise RoyalsJonathan D, PA-C  ondansetron (ZOFRAN ODT) 4 MG disintegrating tablet Take 1 tablet (4 mg total) by mouth every 8 (eight) hours as needed for nausea or vomiting. 07/24/17   Irean HongSung, Jade J, MD  oseltamivir (TAMIFLU) 75 MG capsule Take 1 capsule (75 mg total) by mouth 2 (two) times daily. 02/22/15   Cuthriell, Delorise RoyalsJonathan D, PA-C  predniSONE (DELTASONE) 10 MG tablet Take 6 tablets on day 1, take 5 tablets on day 2, take 4 tablets on day 3, take 3 tablets on day 4, take 2 tablets on day 5, take 1 tablet on day 6 03/31/18   Enid DerryWagner, Ashley, PA-C    Allergies Percocet [oxycodone-acetaminophen]  No family history on file.  Social History Social History   Tobacco Use  . Smoking status: Never Smoker  . Smokeless tobacco: Never Used  Substance Use Topics  . Alcohol use: No  . Drug use: Not on file    Review of Systems  Constitutional: Negative for fever. Eyes: Negative for visual changes. ENT: Negative for sore throat. Cardiovascular: Negative for chest pain. Respiratory: Negative for shortness of breath. Gastrointestinal: Positive for lower abdominal/pelvic pain. Denies vomiting and diarrhea. Genitourinary: Negative for dysuria. Repots vaginal discharge Musculoskeletal: Negative for back pain. Skin: Negative for rash. Neurological: Negative for headaches, focal weakness or numbness. ____________________________________________  PHYSICAL EXAM:  VITAL SIGNS: ED Triage Vitals  Enc Vitals Group     BP 06/20/18 1858 128/82     Pulse Rate 06/20/18  1858 78     Resp 06/20/18 1858 16     Temp 06/20/18 1858 98 F (36.7 C)     Temp Source 06/20/18 1858 Oral     SpO2 06/20/18 1858 99 %     Weight 06/20/18 1906 146 lb (66.2 kg)     Height 06/20/18 1906  (1.575 m)     Head Circumference --      Peak Flow --      Pain Score 06/20/18 1905 7     Pain Loc --      Pain Edu? --      Excl. in GC? --     Constitutional: Alert and oriented. Well  appearing and in no distress. Head: Normocephalic and atraumatic. Eyes: Conjunctivae are normal. Normal extraocular movements Cardiovascular: Normal rate, regular rhythm. Normal distal pulses. Respiratory: Normal respiratory effort. No wheezes/rales/rhonchi. Gastrointestinal: Soft, flat, and nontender. No distention. No CVA tenderness. GU: normal external genitalia. Cervix is without erythema or lesions. Thin, mucoid discharge from the cervical os. No adnexal masses or CMT. Musculoskeletal: Nontender with normal range of motion in all extremities.  Neurologic:  Normal gait without ataxia. Normal speech and language. No gross focal neurologic deficits are appreciated. Skin:  Skin is warm, dry and intact. No rash noted. Psychiatric: Mood and affect are normal. Patient exhibits appropriate insight and judgment. ____________________________________________   LABS (pertinent positives/negatives) Labs Reviewed  WET PREP, GENITAL - Abnormal; Notable for the following components:      Result Value   WBC, Wet Prep HPF POC FEW (*)    All other components within normal limits  COMPREHENSIVE METABOLIC PANEL - Abnormal; Notable for the following components:   Calcium 8.7 (*)    All other components within normal limits  CBC - Abnormal; Notable for the following components:   RDW 15.7 (*)    All other components within normal limits  URINALYSIS, COMPLETE (UACMP) WITH MICROSCOPIC - Abnormal; Notable for the following components:   Color, Urine COLORLESS (*)    APPearance CLEAR (*)    Specific Gravity, Urine 1.003 (*)    Hgb urine dipstick SMALL (*)    Bacteria, UA RARE (*)    All other components within normal limits  CHLAMYDIA/NGC RT PCR ([facility] ONLY)  LIPASE, BLOOD  HCG, QUANTITATIVE, PREGNANCY  POCT PREGNANCY, URINE  POC URINE PREG, ED  POCT PREGNANCY, URINE   ____________________________________________  PROCEDURES  Procedures ____________________________________________  INITIAL IMPRESSION / ASSESSMENT AND PLAN / ED COURSE  Garielle Mroz was evaluated in Emergency Department on 06/20/2018 for the symptoms described in the history of present illness. She was evaluated in the context of the global COVID-19 pandemic, which necessitated consideration that the patient might be at risk for infection with the SARS-CoV-2 virus that causes COVID-19. Institutional protocols and algorithms that pertain to the evaluation of patients at risk for COVID-19 are in a state of rapid change based on information released by regulatory bodies including the CDC and federal and state organizations. These policies and algorithms were followed during the patient's care in the ED.  Patient with ED evaluation of a lower pelvic pain and scant discharge for 2 days. She denies menses x 3 months with a history of irregular menses.  Her exam, and labs are reassuring at this time.  Beta quant is negative and urinalysis is without any signs of an acute UTI.  No concern at this time for an ectopic pregnancy, wet prep showed likely physiologic discharge, and GC is pending  at time of discharge.  Clinical exam does not lead me highly concerned for GC at this time.  Patient is stable will be discharged with instructions to follow-up with the [facility] for further evaluation of secondary amenorrhea.  She will be notified via phone if [facility] results return positive. ____________________________________________  FINAL CLINICAL IMPRESSION(S) / ED DIAGNOSES  Final diagnoses:  Secondary amenorrhea  Vaginal discharge      Lissa Hoard, PA-C 06/20/18 2114    Nita Sickle, MD 06/20/18 2317

## 2018-06-20 NOTE — ED Triage Notes (Signed)
Patient reports lower abdominal pain that radiates around to her back for 2 days.  Patient also reports no menstrual for 3 months and reports yellowish discharge.

## 2018-09-22 ENCOUNTER — Emergency Department
Admission: EM | Admit: 2018-09-22 | Discharge: 2018-09-23 | Disposition: A | Discharge status: Other Acute Inpatient Hospital | Payer: Self-pay | Attending: Emergency Medicine | Admitting: Emergency Medicine

## 2018-09-22 ENCOUNTER — Encounter: Payer: Self-pay | Admitting: Emergency Medicine

## 2018-09-22 ENCOUNTER — Other Ambulatory Visit: Payer: Self-pay

## 2018-09-22 DIAGNOSIS — F4325 Adjustment disorder with mixed disturbance of emotions and conduct: Secondary | ICD-10-CM | POA: Diagnosis present

## 2018-09-22 DIAGNOSIS — F329 Major depressive disorder, single episode, unspecified: Secondary | ICD-10-CM | POA: Insufficient documentation

## 2018-09-22 DIAGNOSIS — Z20828 Contact with and (suspected) exposure to other viral communicable diseases: Secondary | ICD-10-CM | POA: Insufficient documentation

## 2018-09-22 DIAGNOSIS — R45851 Suicidal ideations: Secondary | ICD-10-CM | POA: Insufficient documentation

## 2018-09-22 DIAGNOSIS — F322 Major depressive disorder, single episode, severe without psychotic features: Secondary | ICD-10-CM | POA: Diagnosis present

## 2018-09-22 DIAGNOSIS — F332 Major depressive disorder, recurrent severe without psychotic features: Secondary | ICD-10-CM | POA: Diagnosis present

## 2018-09-22 DIAGNOSIS — Z630 Problems in relationship with spouse or partner: Secondary | ICD-10-CM | POA: Insufficient documentation

## 2018-09-22 DIAGNOSIS — R51 Headache: Secondary | ICD-10-CM | POA: Insufficient documentation

## 2018-09-22 DIAGNOSIS — Z046 Encounter for general psychiatric examination, requested by authority: Secondary | ICD-10-CM | POA: Insufficient documentation

## 2018-09-22 LAB — CBC
HCT: 43.6 % (ref 36.0–46.0)
Hemoglobin: 13.9 g/dL (ref 12.0–15.0)
MCH: 27.1 pg (ref 26.0–34.0)
MCHC: 31.9 g/dL (ref 30.0–36.0)
MCV: 85.2 fL (ref 80.0–100.0)
Platelets: 289 10*3/uL (ref 150–400)
RBC: 5.12 MIL/uL — ABNORMAL HIGH (ref 3.87–5.11)
RDW: 14.8 % (ref 11.5–15.5)
WBC: 7.3 10*3/uL (ref 4.0–10.5)
nRBC: 0 % (ref 0.0–0.2)

## 2018-09-22 LAB — POCT PREGNANCY, URINE: Preg Test, Ur: NEGATIVE

## 2018-09-22 LAB — COMPREHENSIVE METABOLIC PANEL
ALT: 19 U/L (ref 0–44)
AST: 21 U/L (ref 15–41)
Albumin: 4.5 g/dL (ref 3.5–5.0)
Alkaline Phosphatase: 97 U/L (ref 38–126)
Anion gap: 8 (ref 5–15)
BUN: 18 mg/dL (ref 6–20)
CO2: 23 mmol/L (ref 22–32)
Calcium: 9.2 mg/dL (ref 8.9–10.3)
Chloride: 110 mmol/L (ref 98–111)
Creatinine, Ser: 0.73 mg/dL (ref 0.44–1.00)
GFR calc Af Amer: >60 mL/min (ref >60)
GFR calc non Af Amer: >60 mL/min (ref >60)
Glucose, Bld: 107 mg/dL — ABNORMAL HIGH (ref 70–99)
Potassium: 3.8 mmol/L (ref 3.5–5.1)
Sodium: 141 mmol/L (ref 135–145)
Total Bilirubin: 0.8 mg/dL (ref 0.3–1.2)
Total Protein: 8.2 g/dL — ABNORMAL HIGH (ref 6.5–8.1)

## 2018-09-22 LAB — URINE DRUG SCREEN, QUALITATIVE (ARMC ONLY)
Amphetamines, Ur Screen: NOT DETECTED
Barbiturates, Ur Screen: NOT DETECTED
Benzodiazepine, Ur Scrn: NOT DETECTED
Cannabinoid 50 Ng, Ur ~~LOC~~: NOT DETECTED
Cocaine Metabolite,Ur ~~LOC~~: NOT DETECTED
MDMA (Ecstasy)Ur Screen: NOT DETECTED
Methadone Scn, Ur: NOT DETECTED
Opiate, Ur Screen: NOT DETECTED
Phencyclidine (PCP) Ur S: NOT DETECTED
Tricyclic, Ur Screen: NOT DETECTED

## 2018-09-22 LAB — ACETAMINOPHEN LEVEL
Acetaminophen (Tylenol), Serum: <10 ug/mL — ABNORMAL LOW (ref 10–30)
Acetaminophen (Tylenol), Serum: <10 ug/mL — ABNORMAL LOW (ref 10–30)

## 2018-09-22 LAB — SARS CORONAVIRUS 2 BY RT PCR (HOSPITAL ORDER, PERFORMED IN ~~LOC~~ HOSPITAL LAB): SARS Coronavirus 2: NEGATIVE

## 2018-09-22 LAB — ETHANOL: Alcohol, Ethyl (B): <10 mg/dL (ref <10)

## 2018-09-22 LAB — SALICYLATE LEVEL: Salicylate Lvl: <7 mg/dL (ref 2.8–30.0)

## 2018-09-22 NOTE — ED Notes (Signed)
Pt given blanket and brown socks.

## 2018-09-22 NOTE — BH Assessment (Addendum)
Assessment Note  Gabriela Carter is an 38 y.o. female who presents to ED with increased depressive sxs and suicidal thoughts. She reports having thoughts to take an overdose of tylenol pills. She reports having martial/infidelity issues that were recently discovered by her husband who then told her family members. Pt states "I have been married for 20 years. For the past 3 years my husband has been working out of town. He only comes home every 8 days. I was telling him I needed his attention. I have practically been taking care of the children by myself. Two months ago I met someone and he became more than a friend. My husband found out about it." Pt became tearful as she explain the recent stressful events. She reports he family started to disown her because of her infidelity. Pt reports decreased sleep patterns and poor appetite. Pt denied HI/AVH. She was tearful with a flat affect during assessment.   Diagnosis: Adjustment Disorder, unspecified  Past Medical History: History reviewed. No pertinent past medical history.  Past Surgical History:  Procedure Laterality Date  . APPENDECTOMY      Family History: No family history on file.  Social History:  reports that she has never smoked. She has never used smokeless tobacco. She reports that she does not drink alcohol. No history on file for drug.  Additional Social History:  Alcohol / Drug Use Pain Medications: See MAR Prescriptions: See MAR Over the Counter: See MAR History of alcohol / drug use?: No history of alcohol / drug abuse  CIWA: CIWA-Ar BP: (!) 107/91 Pulse Rate: 83 COWS:    Allergies:  Allergies  Allergen Reactions  . Percocet [Oxycodone-Acetaminophen]     Home Medications: (Not in a hospital admission)   OB/GYN Status:  Patient's last menstrual period was 09/01/2018 (approximate).  General Assessment Data Location of Assessment: [facility] ED TTS Assessment: In system Is this a Tele or Face-to-Face Assessment?:  Face-to-Face Is this an Initial Assessment or a Re-assessment for this encounter?: Initial Assessment Patient Accompanied by:: N/A Language Other than English: Yes What is your preferred language: Spanish Living Arrangements: Other (Comment)(Private Residence) What gender do you identify as?: Female Marital status: Married [place] name: Myer Haff Pregnancy Status: No Living Arrangements: Spouse/significant other, Children(Children: 17yo, 12yo, 11yo, 4yo) Can pt return to current living arrangement?: Yes Admission Status: Voluntary Is patient capable of signing voluntary admission?: Yes Referral Source: Self/Family/Friend Insurance type: None  Medical Screening Exam (Benjamin Perez) Medical Exam completed: Yes  Crisis Care Plan Living Arrangements: Spouse/significant other, Children(Children: 17yo, 12yo, 11yo, 4yo) Legal Guardian: Other:(N/A) Name of Psychiatrist: None Name of Therapist: None  Education Status Is patient currently in school?: No Is the patient employed, unemployed or receiving disability?: Myer Haff)  Risk to self with the past 6 months Suicidal Ideation: Yes-Currently Present Has patient been a risk to self within the past 6 months prior to admission? : No Suicidal Intent: Yes-Currently Present Has patient had any suicidal intent within the past 6 months prior to admission? : No Is patient at risk for suicide?: Yes Suicidal Plan?: Yes-Currently Present Has patient had any suicidal plan within the past 6 months prior to admission? : No Specify Current Suicidal Plan: Pt had a plan to overdose on tylenol pills Access to Means: Yes Specify Access to Suicidal Means: Pt has access to OTC pills What has been your use of drugs/alcohol within the last 12 months?: None Previous Attempts/Gestures: No How many times?: 0 Other Self Harm Risks: None Triggers for Past  Attempts: None known Intentional Self Injurious Behavior: None Family Suicide History: Unknown Recent  stressful life event(s): Conflict (Comment), Other (Comment), Loss (Comment)(Infidelity) Persecutory voices/beliefs?: No Depression: Yes Depression Symptoms: Tearfulness, Isolating, Guilt, Insomnia, Loss of interest in usual pleasures, Feeling worthless/self pity Substance abuse history and/or treatment for substance abuse?: No Suicide prevention information given to non-admitted patients: Not applicable  Risk to Others within the past 6 months Homicidal Ideation: No Does patient have any lifetime risk of violence toward others beyond the six months prior to admission? : No Thoughts of Harm to Others: No Current Homicidal Intent: No Current Homicidal Plan: No Access to Homicidal Means: No Identified Victim: None History of harm to others?: No Assessment of Violence: None Noted Violent Behavior Description: None Does patient have access to weapons?: No Criminal Charges Pending?: No Does patient have a court date: No Is patient on probation?: No  Psychosis Hallucinations: None noted Delusions: None noted  Mental Status Report Appearance/Hygiene: In scrubs, Unremarkable Eye Contact: Poor Motor Activity: Freedom of movement Speech: Language other than English, Logical/coherent Level of Consciousness: Crying, Alert Mood: Depressed, Sad Affect: Sad, Depressed, Flat Anxiety Level: Minimal Thought Processes: Coherent, Relevant Judgement: Unimpaired Orientation: Person, Place, Time, Situation, Appropriate for developmental age Obsessive Compulsive Thoughts/Behaviors: None  Cognitive Functioning Concentration: Normal Memory: Recent Intact, Remote Intact Is patient IDD: No Insight: Poor Impulse Control: Fair Appetite: Poor Have you had any weight changes? : No Change Sleep: Decreased Total Hours of Sleep: 5 Vegetative Symptoms: None  ADLScreening [facility] Assessment Services) Patient's cognitive ability adequate to safely complete daily activities?: Yes Patient able to express  need for assistance with ADLs?: Yes Independently performs ADLs?: Yes (appropriate for developmental age)  Prior Inpatient Therapy Prior Inpatient Therapy: No  Prior Outpatient Therapy Prior Outpatient Therapy: No Does patient have an ACCT team?: No Does patient have Intensive In-House Services?  : No Does patient have [facility] services? : No Does patient have P4CC services?: No  ADL Screening (condition at time of admission) Patient's cognitive ability adequate to safely complete daily activities?: Yes Patient able to express need for assistance with ADLs?: Yes Independently performs ADLs?: Yes (appropriate for developmental age)       Abuse/Neglect Assessment (Assessment to be complete while patient is alone) Abuse/Neglect Assessment Can Be Completed: Yes Physical Abuse: Denies Verbal Abuse: Denies Sexual Abuse: Denies Exploitation of patient/patient's resources: Denies Self-Neglect: Denies Values / Beliefs Cultural Requests During Hospitalization: None Spiritual Requests During Hospitalization: None Consults Spiritual Care Consult Needed: No Social Work Consult Needed: No Regulatory affairs officer (For Healthcare) Does Patient Have a Medical Advance Directive?: No Would patient like information on creating a medical advance directive?: No - Patient declined       Child/Adolescent Assessment Running Away Risk: (Patient is an adult)  Disposition:  Disposition Initial Assessment Completed for this Encounter: Yes Disposition of Patient: (Observe overnight; reassess in the AM)  On Site Evaluation by:   Reviewed with Physician:    Frederich Cha 09/22/2018 6:34 PM

## 2018-09-22 NOTE — ED Notes (Signed)
Report to include Situation, Background, Assessment, and Recommendations received from Jordan RN. Patient alert and oriented, warm and dry, in no acute distress. Patient denies SI, HI, AVH and pain. Patient made aware of Q15 minute rounds and Rover and Officer presence for their safety. Patient instructed to come to me with needs or concerns.   

## 2018-09-22 NOTE — ED Notes (Signed)
Pt states she tried but did not take tylenol pills around 11-12 this afternoon. States she is going through marital issues and wanted to end her life. PT denies any SI in the past. PT tearful during assessment.

## 2018-09-22 NOTE — ED Notes (Signed)
ED Provider at bedside with interpreter on stick 

## 2018-09-22 NOTE — ED Notes (Signed)
Hourly rounding reveals patient sleeping in room. No complaints, stable, in no acute distress. Q15 minute rounds and monitoring via Rover and Officer to continue.  

## 2018-09-22 NOTE — ED Triage Notes (Signed)
(  Using Spanish video interpreter) Patient presents to the ED via [facility] PD stating she is having thoughts of, "taking pills".  This RN asked patient if this was to commit suicide and patient states, "yes."  Patient states today was the first day she felt this way.  Patient states she is having "family problems".  Patient reports having some similar problems before but they "keep getting worse, they keep getting aggravated."  Patient denies SI.  Patient denies auditory or visual hallucinations.

## 2018-09-22 NOTE — ED Notes (Signed)
TTS requested interpretor services to speak to patient's husband Macyn Shropshire: [phone]).   The interpreter has reported that after introductions the pts husband has disconnected the call and we have been unable to contact Mrs. Wynn for addition comments.

## 2018-09-22 NOTE — BH Assessment (Signed)
TTS requested interpretor services to speak to patient's husband Mileydi Milsap: [phone]).

## 2018-09-22 NOTE — ED Notes (Signed)
Hourly rounding reveals patient in room. No complaints, stable, in no acute distress. Q15 minute rounds and monitoring via Rover and Officer to continue.   

## 2018-09-22 NOTE — ED Notes (Signed)
ED TO INPATIENT HANDOFF REPORT  ED Nurse Name and Phone #: [email or web address] [phone]  S Name/Age/Gender Gabriela PeersIndira Carter 38 y.o. female Room/Bed: ED23A/ED23AA  Code Status   Code Status: Not on file  Home/SNF/Other Home Patient oriented to: self, place, time and situation Is this baseline? Yes   Triage Complete: Triage complete  Chief Complaint voluntary  Triage Note (Using Spanish video interpreter) [facility] stating she is having thoughts of, "taking pills".  This RN asked patient if this was to commit suicide and patient states, "yes."  Patient states today was the first day she felt this way.  Patient states she is having "family problems".  Patient reports having some similar problems before but they "keep getting worse, they keep getting aggravated."  Patient denies SI.  Patient denies auditory or visual hallucinations.     Allergies Allergies  Allergen Reactions  . Percocet [Oxycodone-Acetaminophen]     Level of Care/Admitting Diagnosis ED Disposition    ED Disposition Condition Comment   [facility]  Patient has been medically cleared and is in stable condition, and is being admitted [facility].       B Medical/Surgery History History reviewed. No pertinent past medical history. Past Surgical History:  Procedure Laterality Date  . APPENDECTOMY       A IV Location/Drains/Wounds Patient Lines/Drains/Airways Status   Active Line/Drains/Airways    None          Intake/Output Last 24 hours No intake or output data in the 24 hours ending 09/22/18 1812  Labs/Imaging Results for orders placed or performed during the hospital encounter of 09/22/18 (from the past 48 hour(s))  Urine Drug Screen, Qualitative     Status: None   Collection Time: 09/22/18  1:03 PM  Result Value Ref Range   Tricyclic, Ur Screen NONE DETECTED NONE DETECTED   Amphetamines, Ur Screen NONE DETECTED  NONE DETECTED   MDMA (Ecstasy)Ur Screen NONE DETECTED NONE DETECTED   Cocaine Metabolite,Ur [place] NONE DETECTED NONE DETECTED   Opiate, Ur Screen NONE DETECTED NONE DETECTED   Phencyclidine (PCP) Ur S NONE DETECTED NONE DETECTED   Cannabinoid 50 Ng, Ur [place] NONE DETECTED NONE DETECTED   Barbiturates, Ur Screen NONE DETECTED NONE DETECTED   Benzodiazepine, Ur Scrn NONE DETECTED NONE DETECTED   Methadone Scn, Ur NONE DETECTED NONE DETECTED    Comment: (NOTE) Tricyclics + metabolites, urine    Cutoff 1000 ng/mL Amphetamines + metabolites, urine  Cutoff 1000 ng/mL MDMA (Ecstasy), urine              Cutoff 500 ng/mL Cocaine Metabolite, urine          Cutoff 300 ng/mL Opiate + metabolites, urine        Cutoff 300 ng/mL Phencyclidine (PCP), urine         Cutoff 25 ng/mL Cannabinoid, urine                 Cutoff 50 ng/mL Barbiturates + metabolites, urine  Cutoff 200 ng/mL Benzodiazepine, urine              Cutoff 200 ng/mL Methadone, urine                   Cutoff 300 ng/mL The urine drug screen provides only a preliminary, unconfirmed analytical test result and should not be used for non-medical purposes. Clinical consideration and professional judgment should be applied to any positive  drug screen result due to possible interfering substances. A more specific alternate chemical method must be used in order to obtain a confirmed analytical result. Gas chromatography / mass spectrometry (GC/MS) is the preferred confirmat ory method. Performed at [facility], [address] [place], [place] [address]   Comprehensive metabolic panel     Status: Abnormal   Collection Time: 09/22/18  1:05 PM  Result Value Ref Range   Sodium 141 135 - 145 mmol/L   Potassium 3.8 3.5 - 5.1 mmol/L   Chloride 110 98 - 111 mmol/L   CO2 23 22 - 32 mmol/L   Glucose, Bld 107 (H) 70 - 99 mg/dL   BUN 18 6 - 20 mg/dL   Creatinine, Ser 0.73 0.44 - 1.00 mg/dL   Calcium 9.2 8.9 - 10.3 mg/dL   Total Protein 8.2  (H) 6.5 - 8.1 g/dL   Albumin 4.5 3.5 - 5.0 g/dL   AST 21 15 - 41 U/L   ALT 19 0 - 44 U/L   Alkaline Phosphatase 97 38 - 126 U/L   Total Bilirubin 0.8 0.3 - 1.2 mg/dL   GFR calc non Af Amer >60 >60 mL/min   GFR calc Af Amer >60 >60 mL/min   Anion gap 8 5 - 15    Comment: Performed at [facility], [address], [place], [place] [address]  Ethanol     Status: None   Collection Time: 09/22/18  1:05 PM  Result Value Ref Range   Alcohol, Ethyl (B) <10 <10 mg/dL    Comment: (NOTE) Lowest detectable limit for serum alcohol is 10 mg/dL. For medical purposes only. Performed at [facility] [address] [place], [place] [address]   Salicylate level     Status: None   Collection Time: 09/22/18  1:05 PM  Result Value Ref Range   Salicylate Lvl <0.1 2.8 - 30.0 mg/dL    Comment: Performed at [facility], [address] [place], [place] [address]  Acetaminophen level     Status: Abnormal   Collection Time: 09/22/18  1:05 PM  Result Value Ref Range   Acetaminophen (Tylenol), Serum <10 (L) 10 - 30 ug/mL    Comment: (NOTE) Therapeutic concentrations vary significantly. A range of 10-30 ug/mL  may be an effective concentration for many patients. However, some  are best treated at concentrations outside of this range. Acetaminophen concentrations >150 ug/mL at 4 hours after ingestion  and >50 ug/mL at 12 hours after ingestion are often associated with  toxic reactions. Performed at [facility], [address] [place], [place] [address]   cbc     Status: Abnormal   Collection Time: 09/22/18  1:05 PM  Result Value Ref Range   WBC 7.3 4.0 - 10.5 K/uL   RBC 5.12 (H) 3.87 - 5.11 MIL/uL   Hemoglobin 13.9 12.0 - 15.0 g/dL   HCT 43.6 36.0 - 46.0 %   MCV 85.2 80.0 - 100.0 fL   MCH 27.1 26.0 - 34.0 pg   MCHC 31.9 30.0 - 36.0 g/dL   RDW 14.8 11.5 - 15.5 %   Platelets 289 150 - 400 K/uL   nRBC 0.0 0.0 - 0.2 %    Comment: Performed at [facility], [address] [place], [place] [address]  Pregnancy, urine POC     Status: None   Collection Time: 09/22/18  1:56 PM  Result Value Ref Range   Preg Test, Ur NEGATIVE NEGATIVE  Comment:        THE SENSITIVITY OF THIS METHODOLOGY IS >24 mIU/mL   Acetaminophen level     Status: Abnormal   Collection Time: 09/22/18  3:00 PM  Result Value Ref Range   Acetaminophen (Tylenol), Serum <10 (L) 10 - 30 ug/mL    Comment: (NOTE) Therapeutic concentrations vary significantly. A range of 10-30 ug/mL  may be an effective concentration for many patients. However, some  are best treated at concentrations outside of this range. Acetaminophen concentrations >150 ug/mL at 4 hours after ingestion  and >50 ug/mL at 12 hours after ingestion are often associated with  toxic reactions. Performed at [facility], [address] [place], [place] [id number]    No results found.  Pending Labs [facility] (From admission, onward)    Start     Ordered   09/22/18 1810  SARS Coronavirus 2 [facility](Hospital order, Performed in [facility] hospital lab) Nasopharyngeal Nasopharyngeal Swab  (Symptomatic/High Risk of Exposure/Tier 1 Patients Labs with Precautions)  Once,   STAT    Question Answer Comment  Is this test for diagnosis or screening Diagnosis of ill patient   Symptomatic for COVID-19 as defined by CDC No   Hospitalized for COVID-19 No   Admitted to ICU for COVID-19 No   Previously tested for COVID-19 Yes   Resident in a congregate (group) care setting No   Employed in healthcare setting No   Pregnant No      09/22/18 1810          Vitals/Pain Today's Vitals   09/22/18 1253 09/22/18 1256 09/22/18 1301 09/22/18 1304  BP:  (!) 107/91    Pulse:  83    Resp:  16    Temp:  98.2 F (36.8 C)    TempSrc:  Oral    SpO2:  96%    Weight: 66.7 kg   66.7 kg  Height: 5\' 2"  (1.575 m)   5\' 2"  (1.575 m)  PainSc:   7      Isolation Precautions Airborne and Contact  precautions  Medications Medications - No data to display  Mobility walks Low fall risk   Focused Assessments    R Recommendations: See Admitting Provider Note  Report given to:   Additional Notes:

## 2018-09-22 NOTE — ED Provider Notes (Signed)
[facility] Emergency Department Provider Note       Time seen: ----------------------------------------- 1:56 PM on 09/22/2018 -----------------------------------------   I have reviewed the triage vital signs and the nursing notes.  HISTORY   Chief Complaint Suicidal    HPI Gabriela Carter is a 38 y.o. female with no significant past medical history no recent illness who presents to the ED for suicidal ideations.  Patient presents to the ER by [facility] PD stating she was having thoughts of overdosing on Tylenol.  She was thinking about doing this at 30 or 11 today.  She has never felt this way before, states she has been going through marital problems.  She denies hallucinations.  History reviewed. No pertinent past medical history.  There are no active problems to display for this patient.   Past Surgical History:  Procedure Laterality Date  . APPENDECTOMY      Allergies Percocet [oxycodone-acetaminophen]  Social History Social History   Tobacco Use  . Smoking status: Never Smoker  . Smokeless tobacco: Never Used  Substance Use Topics  . Alcohol use: No  . Drug use: Not on file   Review of Systems Constitutional: Negative for fever. Cardiovascular: Negative for chest pain. Respiratory: Negative for shortness of breath. Gastrointestinal: Negative for abdominal pain, vomiting and diarrhea. Musculoskeletal: Negative for back pain. Skin: Negative for rash. Neurological: Negative for headaches, focal weakness or numbness. Psychiatric: Positive for suicidal ideations  All systems negative/normal/unremarkable except as stated in the HPI  ____________________________________________   PHYSICAL EXAM:  VITAL SIGNS: ED Triage Vitals  Enc Vitals Group     BP 09/22/18 1256 (!) 107/91     Pulse Rate 09/22/18 1256 83     Resp 09/22/18 1256 16     Temp 09/22/18 1256 98.2 F (36.8 C)     Temp Source 09/22/18 1256 Oral     SpO2 09/22/18  1256 96 %     Weight 09/22/18 1253 147 lb (66.7 kg)     Height 09/22/18 1253 5\' 2"  (1.575 m)     Head Circumference --      Peak Flow --      Pain Score 09/22/18 1301 7     Pain Loc --      Pain Edu? --      Excl. in [place]? --    Constitutional: Alert and oriented. Well appearing and in no distress. Eyes: Conjunctivae are normal. Normal extraocular movements. Cardiovascular: Normal rate, regular rhythm. No murmurs, rubs, or gallops. Respiratory: Normal respiratory effort without tachypnea nor retractions. Breath sounds are clear and equal bilaterally. No wheezes/rales/rhonchi. Gastrointestinal: Soft and nontender. Normal bowel sounds Musculoskeletal: Nontender with normal range of motion in extremities. No lower extremity tenderness nor edema. Neurologic:  Normal speech and language. No gross focal neurologic deficits are appreciated.  Skin:  Skin is warm, dry and intact. No rash noted. Psychiatric: Depressed mood and affect ____________________________________________  ED COURSE:  As part of my medical decision making, I reviewed the following data within the [place] History obtained from family if available, nursing notes, old chart and ekg, as well as notes from prior ED visits. Patient presented for depressive thoughts with suicidal ideation, we will assess with labs as indicated at this time.   Procedures  [name] was evaluated in Emergency Department on 09/22/2018 for the symptoms described in the history of present illness. She was evaluated in the context of the global COVID-19 pandemic, which necessitated consideration that the patient might  be at risk for infection with the SARS-CoV-2 virus that causes COVID-19. Institutional protocols and algorithms that pertain to the evaluation of patients at risk for COVID-19 are in a state of rapid change based on information released by regulatory bodies including the CDC and federal and state organizations. These  policies and algorithms were followed during the patient's care in the ED.  ____________________________________________   LABS (pertinent positives/negatives)  Labs Reviewed  COMPREHENSIVE METABOLIC PANEL - Abnormal; Notable for the following components:      Result Value   Glucose, Bld 107 (*)    Total Protein 8.2 (*)    All other components within normal limits  ACETAMINOPHEN LEVEL - Abnormal; Notable for the following components:   Acetaminophen (Tylenol), Serum <10 (*)    All other components within normal limits  CBC - Abnormal; Notable for the following components:   RBC 5.12 (*)    All other components within normal limits  ETHANOL  SALICYLATE LEVEL  URINE DRUG SCREEN, QUALITATIVE ([facility] ONLY)  POC URINE PREG, ED   ____________________________________________   DIFFERENTIAL DIAGNOSIS   SI, depression, adjustment disorder  FINAL ASSESSMENT AND PLAN  Suicidal ideation   Plan: The patient had presented for suicidal ideation secondary to marital problems. Patient's labs did not reveal any acute process, she appears medically clear for psychiatric evaluation and disposition.Gabriela Carter.   Gabriela Carter Tomara Youngberg, MD    Note: This note was generated in part or whole with voice recognition software. Voice recognition is usually quite accurate but there are transcription errors that can and very often do occur. I apologize for any typographical errors that were not detected and corrected.     Gabriela Carter, Gabriela Najjar E, MD 09/22/18 [phone]

## 2018-09-22 NOTE — BH Assessment (Signed)
Pt's husband was called (using interpreter services) to obtain collateral information - no answer. HIPAA compliant voicemail left with callback number.

## 2018-09-22 NOTE — Consult Note (Signed)
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 female patient presented with suicide attempt during an altercation with her spouse.  "I don't know what to do."  Patient seen and evaluated in person face-to-face with a Spanish interpreter.  38 yo female who presented to the ED after an altercation with her spouse and trying to overdose on acetaminophen.  She put the pills in her mouth in front of him and he took them out.  They were arguing because of infidelity issues.  Marital issues for the past three years when her husband began working out of town.  He was gone for eight days at a time and she was managing their four children along with the finances and household affairs.  He began to ignore her and she addressed this with him to no avail.  She began communicating with another man and the relationship became more involved.  Her husband found out and demanded she stop which she did but resumed later.  Today, he came home early from is trip and confronted her with this relationship.  Her oldest child walked into the situation and discovered this personal information.  He became upset and told family who disowned her at this time, "You are dead to [place]."  Her husband agreed to not tell anyone in the family and she expresses his betrayal.    Patient crying on assessment and expressing shame and guilt.  She feels she has lost her family and does not know what to do.  Does not want to actively hurt herself at this time but cannot contract for safety during assessment, "I don't know."  Later, she calmed down and requested to leave.  No past  psychiatric history or suicide attempts.  Now the patient denies suicidal/homicidal ideations, hallucinations, and substance abuse.  Considering her previous emotional state and current turmoil at home, overnight observation recommended with collateral information from her spouse.  Spouse contacted by TTS with no success.  Attempts will continue.  HPI per TTS:  38 y.o. female who presents to ED with increased depressive sxs and suicidal thoughts. She reports having thoughts to take an overdose of tylenol pills. She reports having martial/infidelity issues that were recently discovered by her husband who then told her family members. Pt states "I have been married for 20 years. For the past 3 years my husband has been working out of town. He only comes home every 8 days. I was telling him I needed his attention. I have practically been taking care of the children by myself. Two months ago I met someone and he became more than a friend. My husband found out about it." Pt became tearful as she explain the recent stressful events. She reports he family started to disown her because of her infidelity. Pt reports decreased sleep patterns and poor appetite. Pt denied HI/AVH. She was tearful with a flat affect during assessment.   Past Psychiatric History: none  Risk to Self:  none Risk to Others:  none Prior Inpatient Therapy:  none Prior Outpatient Therapy:  none  Past Medical History: History reviewed. No pertinent past medical history.  Past Surgical History:  Procedure Laterality Date  .  APPENDECTOMY     Family History: No family history on file. Family Psychiatric  History: none Social History:  Social History   Substance and Sexual Activity  Alcohol Use No     Social History   Substance and Sexual Activity  Drug Use Not on file    Social History   Socioeconomic History  . Marital status: Married    Spouse name: Not on file  . Number of children: Not on file  . Years of education:  Not on file  . Highest education level: Not on file  Occupational History  . Not on file  Social Needs  . Financial resource strain: Not on file  . Food insecurity    Worry: Not on file    Inability: Not on file  . Transportation needs    Medical: Not on file    Non-medical: Not on file  Tobacco Use  . Smoking status: Never Smoker  . Smokeless tobacco: Never Used  Substance and Sexual Activity  . Alcohol use: No  . Drug use: Not on file  . Sexual activity: Not on file  Lifestyle  . Physical activity    Days per week: Not on file    Minutes per session: Not on file  . Stress: Not on file  Relationships  . Social Herbalist on phone: Not on file    Gets together: Not on file    Attends religious service: Not on file    Active member of club or organization: Not on file    Attends meetings of clubs or organizations: Not on file    Relationship status: Not on file  Other Topics Concern  . Not on file  Social History Narrative  . Not on file   Additional Social History:    Allergies:   Allergies  Allergen Reactions  . Percocet [Oxycodone-Acetaminophen]     Labs:  Results for orders placed or performed during the hospital encounter of 09/22/18 (from the past 48 hour(s))  Urine Drug Screen, Qualitative     Status: None   Collection Time: 09/22/18  1:03 PM  Result Value Ref Range   Tricyclic, Ur Screen NONE DETECTED NONE DETECTED   Amphetamines, Ur Screen NONE DETECTED NONE DETECTED   MDMA (Ecstasy)Ur Screen NONE DETECTED NONE DETECTED   Cocaine Metabolite,Ur [place] NONE DETECTED NONE DETECTED   Opiate, Ur Screen NONE DETECTED NONE DETECTED   Phencyclidine (PCP) Ur S NONE DETECTED NONE DETECTED   Cannabinoid 50 Ng, Ur  NONE DETECTED NONE DETECTED   Barbiturates, Ur Screen NONE DETECTED NONE DETECTED   Benzodiazepine, Ur Scrn NONE DETECTED NONE DETECTED   Methadone Scn, Ur NONE DETECTED NONE DETECTED    Comment: (NOTE) Tricyclics + metabolites, urine     Cutoff 1000 ng/mL Amphetamines + metabolites, urine  Cutoff 1000 ng/mL MDMA (Ecstasy), urine              Cutoff 500 ng/mL Cocaine Metabolite, urine          Cutoff 300 ng/mL Opiate + metabolites, urine        Cutoff 300 ng/mL Phencyclidine (PCP), urine         Cutoff 25 ng/mL Cannabinoid, urine                 Cutoff 50 ng/mL Barbiturates + metabolites, urine  Cutoff 200 ng/mL Benzodiazepine, urine              Cutoff 200 ng/mL Methadone, urine  Cutoff 300 ng/mL The urine drug screen provides only a preliminary, unconfirmed analytical test result and should not be used for non-medical purposes. Clinical consideration and professional judgment should be applied to any positive drug screen result due to possible interfering substances. A more specific alternate chemical method must be used in order to obtain a confirmed analytical result. Gas chromatography / mass spectrometry (GC/MS) is the preferred confirmat ory method. Performed at [facility], [address] [place], [place] [address]   Comprehensive metabolic panel     Status: Abnormal   Collection Time: 09/22/18  1:05 PM  Result Value Ref Range   Sodium 141 135 - 145 mmol/L   Potassium 3.8 3.5 - 5.1 mmol/L   Chloride 110 98 - 111 mmol/L   CO2 23 22 - 32 mmol/L   Glucose, Bld 107 (H) 70 - 99 mg/dL   BUN 18 6 - 20 mg/dL   Creatinine, Ser 0.73 0.44 - 1.00 mg/dL   Calcium 9.2 8.9 - 10.3 mg/dL   Total Protein 8.2 (H) 6.5 - 8.1 g/dL   Albumin 4.5 3.5 - 5.0 g/dL   AST 21 15 - 41 U/L   ALT 19 0 - 44 U/L   Alkaline Phosphatase 97 38 - 126 U/L   Total Bilirubin 0.8 0.3 - 1.2 mg/dL   GFR calc non Af Amer >60 >60 mL/min   GFR calc Af Amer >60 >60 mL/min   Anion gap 8 5 - 15    Comment: Performed at [facility], [address] [place], [place] [address]  Ethanol     Status: None   Collection Time: 09/22/18  1:05 PM  Result Value Ref Range   Alcohol, Ethyl (B) <10 <10 mg/dL    Comment:  (NOTE) Lowest detectable limit for serum alcohol is 10 mg/dL. For medical purposes only. Performed at [facility], [address] [place], [place] [address]   Salicylate level     Status: None   Collection Time: 09/22/18  1:05 PM  Result Value Ref Range   Salicylate Lvl <1.5 2.8 - 30.0 mg/dL    Comment: Performed at [facility], [address] [place], [place] [address]  Acetaminophen level     Status: Abnormal   Collection Time: 09/22/18  1:05 PM  Result Value Ref Range   Acetaminophen (Tylenol), Serum <10 (L) 10 - 30 ug/mL    Comment: (NOTE) Therapeutic concentrations vary significantly. A range of 10-30 ug/mL  may be an effective concentration for many patients. However, some  are best treated at concentrations outside of this range. Acetaminophen concentrations >150 ug/mL at 4 hours after ingestion  and >50 ug/mL at 12 hours after ingestion are often associated with  toxic reactions. Performed at [facility], [address] [place],  [address]   cbc     Status: Abnormal   Collection Time: 09/22/18  1:05 PM  Result Value Ref Range   WBC 7.3 4.0 - 10.5 K/uL   RBC 5.12 (H) 3.87 - 5.11 MIL/uL   Hemoglobin 13.9 12.0 - 15.0 g/dL   HCT 43.6 36.0 - 46.0 %   MCV 85.2 80.0 - 100.0 fL   MCH 27.1 26.0 - 34.0 pg   MCHC 31.9 30.0 - 36.0 g/dL   RDW 14.8 11.5 - 15.5 %   Platelets 289 150 - 400 K/uL   nRBC 0.0 0.0 - 0.2 %    Comment: Performed at [facility], [address] [place], [place]  [address]  Pregnancy, urine POC     Status: None   Collection Time: 09/22/18  1:56 PM  Result Value Ref Range   Preg Test, Ur NEGATIVE NEGATIVE    Comment:        THE SENSITIVITY OF THIS METHODOLOGY IS >24 mIU/mL   Acetaminophen level     Status: Abnormal   Collection Time: 09/22/18  3:00 PM  Result Value Ref Range   Acetaminophen (Tylenol), Serum <10 (L) 10 - 30 ug/mL    Comment: (NOTE) Therapeutic concentrations vary significantly. A  range of 10-30 ug/mL  may be an effective concentration for many patients. However, some  are best treated at concentrations outside of this range. Acetaminophen concentrations >150 ug/mL at 4 hours after ingestion  and >50 ug/mL at 12 hours after ingestion are often associated with  toxic reactions. Performed at [facility], [address] [place], [place] [address]     No current facility-administered medications for this encounter.    Current Outpatient Medications  Medication Sig Dispense Refill  . azithromycin (ZITHROMAX Z-PAK) 250 MG tablet Take 2 tablets (500 mg) on  Day 1,  followed by 1 tablet (250 mg) once daily on Days 2 through 5. 6 each 0  . benzonatate (TESSALON PERLES) 100 MG capsule Take 1 capsule (100 mg total) by mouth 3 (three) times daily as needed for cough. 30 capsule 0  . dicyclomine (BENTYL) 20 MG tablet Take 1 tablet (20 mg total) by mouth every 6 (six) hours as needed. 20 tablet 0  . meloxicam (MOBIC) 15 MG tablet Take 1 tablet (15 mg total) by mouth daily. 30 tablet 0  . ondansetron (ZOFRAN ODT) 4 MG disintegrating tablet Take 1 tablet (4 mg total) by mouth every 8 (eight) hours as needed for nausea or vomiting. 20 tablet 0  . oseltamivir (TAMIFLU) 75 MG capsule Take 1 capsule (75 mg total) by mouth 2 (two) times daily. 14 capsule 0  . predniSONE (DELTASONE) 10 MG tablet Take 6 tablets on day 1, take 5 tablets on day 2, take 4 tablets on day 3, take 3 tablets on day 4, take 2 tablets on day 5, take 1 tablet on day 6 21 tablet 0    Musculoskeletal: Strength & Muscle Tone: within normal limits Gait & Station: normal Patient leans: N/A  Psychiatric Specialty Exam: Physical Exam  Nursing note and vitals reviewed. Constitutional: She is oriented to person, place, and time. She appears well-developed and well-nourished.  HENT:  Head: Normocephalic.  Neck: Normal range of motion.  Respiratory: Effort normal.  Musculoskeletal: Normal range of  motion.  Neurological: She is alert and oriented to person, place, and time.  Psychiatric: Her speech is normal and behavior is normal. Thought content normal. Her mood appears anxious. Cognition and memory are normal. She expresses impulsivity. She exhibits a depressed mood.    Review of Systems  Psychiatric/Behavioral: Positive for depression. The patient is nervous/anxious.   All other systems reviewed and are negative.   Blood pressure (!) 107/91, pulse 83, temperature 98.2 F (36.8 C), temperature source Oral, resp. rate 16, height '5\' 2"'$  (1.575 m), weight 66.7 kg, last menstrual period 09/01/2018, SpO2 96 %.Body mass index is 26.89 kg/m.  General Appearance: Casual  Eye Contact:  Good  Speech:  Clear and Coherent and Normal Rate  Volume:  Normal  Mood:  Anxious and Depressed  Affect:  Congruent  Thought Process:  Coherent  Orientation:  Full (Time, Place, and Person)  Thought Content:  Rumination  Suicidal Thoughts:  No  Homicidal Thoughts:  No  Memory:  Immediate;   Good Recent;   Good Remote;   Good  Judgement:  Fair  Insight:  Fair  Psychomotor Activity:  Normal  Concentration:  Concentration: Fair and Attention Span: Fair  Recall:  Good  Fund of Knowledge:  Good  Language:  Good  Akathisia:  No  Handed:  Right  AIMS (if indicated):     Assets:  Leisure Time Physical Health Resilience Social Support  ADL's:  Intact  Cognition:  WNL  Sleep:        Treatment Plan Summary: Daily contact with patient to assess and evaluate symptoms and progress in treatment, Medication management and Plan adjustment disorder with mixed disturbance of emotion and conduct: -Obtain collateral information from spouse  Disposition: Supportive therapy provided about ongoing stressors.  Waylan Boga, NP 09/22/2018 4:12 PM

## 2018-09-22 NOTE — ED Notes (Signed)
TTS and psych at bedside with interpreter at bedside

## 2018-09-22 NOTE — ED Notes (Signed)
Pt provided dinner tray at this time.

## 2018-09-23 ENCOUNTER — Other Ambulatory Visit: Payer: Self-pay

## 2018-09-23 ENCOUNTER — Inpatient Hospital Stay
Admission: AD | Admit: 2018-09-23 | Discharge: 2018-09-25 | DRG: 882 | Disposition: A | Discharge status: Home / Self Care | Payer: No Typology Code available for payment source | Source: Intra-hospital | Attending: Psychiatry | Admitting: Psychiatry

## 2018-09-23 DIAGNOSIS — F4325 Adjustment disorder with mixed disturbance of emotions and conduct: Secondary | ICD-10-CM | POA: Diagnosis not present

## 2018-09-23 DIAGNOSIS — R45851 Suicidal ideations: Secondary | ICD-10-CM | POA: Diagnosis present

## 2018-09-23 DIAGNOSIS — F332 Major depressive disorder, recurrent severe without psychotic features: Secondary | ICD-10-CM | POA: Diagnosis present

## 2018-09-23 DIAGNOSIS — Z20828 Contact with and (suspected) exposure to other viral communicable diseases: Secondary | ICD-10-CM | POA: Diagnosis present

## 2018-09-23 MED ORDER — ALUM & MAG HYDROXIDE-SIMETH 200-200-20 MG/5ML PO SUSP: 30.0000 mL | ORAL | Status: DC | PRN

## 2018-09-23 MED ORDER — IBUPROFEN 600 MG PO TABS
600.0000 mg | ORAL_TABLET | Freq: Once | ORAL | Status: AC
  Administered 2018-09-23: 600 mg via ORAL
  Filled 2018-09-23: qty 1

## 2018-09-23 MED ORDER — IBUPROFEN 200 MG PO TABS: 400.0000 mg | ORAL_TABLET | Freq: Three times a day (TID) | ORAL | Status: DC | PRN

## 2018-09-23 MED ORDER — TRAZODONE HCL 50 MG PO TABS
50.0000 mg | ORAL_TABLET | Freq: Every evening | ORAL | Status: DC | PRN
  Filled 2018-09-23: qty 1

## 2018-09-23 MED ORDER — HYDROXYZINE HCL 25 MG PO TABS: 25.0000 mg | ORAL_TABLET | Freq: Three times a day (TID) | ORAL | Status: DC | PRN

## 2018-09-23 MED ORDER — MAGNESIUM HYDROXIDE 400 MG/5ML PO SUSP: 30.0000 mL | Freq: Every day | ORAL | Status: DC | PRN

## 2018-09-23 NOTE — ED Notes (Signed)
BEHAVIORAL HEALTH ROUNDING Patient sleeping: No. Patient alert and oriented: yes Behavior appropriate: Yes.  ; If no, describe:  Nutrition and fluids offered: yes Toileting and hygiene offered: Yes  Sitter present: q15 minute observations and security camera monitoring Law enforcement present: Yes  ODS  

## 2018-09-23 NOTE — Plan of Care (Signed)
Patient newly admitted, hasn't had time to progress.   Problem: Education: Goal: Knowledge of [place] [facility] Education information/materials will improve Outcome: Not Progressing Goal: Emotional status will improve Outcome: Not Progressing Goal: Mental status will improve Outcome: Not Progressing Goal: Verbalization of understanding the information provided will improve Outcome: Not Progressing   Problem: Safety: Goal: Periods of time without injury will increase Outcome: Not Progressing   Problem: Education: Goal: Ability to make informed decisions regarding treatment will improve Outcome: Not Progressing   Problem: Self-Concept: Goal: Ability to disclose and discuss suicidal ideas will improve Outcome: Not Progressing

## 2018-09-23 NOTE — ED Notes (Signed)
Patient is talking on the phone, she did tell nurse she was cold, nurse gave her a warm blanket, will continue to monitor, no signs of distress.

## 2018-09-23 NOTE — ED Notes (Signed)
PT  VOL/  PENDING  PLACEMENT 

## 2018-09-23 NOTE — BH Assessment (Signed)
Patient is to be admitted to [facility] by Marvia Pickles, NP.  Attending Physician will be Dr. Weber Cooks.   Patient has been assigned to room 305, by [place].   Intake Paper Work has been signed and placed on patient chart.   ER staff is aware of the admission:  Juliann Pulse, ER Secretary    Dr. Cinda Quest, ER MD   Dyke Maes, Patient's Nurse   Elberta Fortis, Patient Access.

## 2018-09-23 NOTE — ED Notes (Signed)
ED BHU [place] Is the patient under IVC or is there intent for IVC:  voluntary Is the patient medically cleared: Yes.   Is there vacancy in the ED BHU: Yes.   Is the population mix appropriate for patient: Yes.   Is the patient awaiting placement in inpatient or outpatient setting:  Has the patient had a psychiatric consult: Yes. Reassessment today  Survey of unit performed for contraband, proper placement and condition of furniture, tampering with fixtures in bathroom, shower, and each patient room: Yes.  ; Findings:  APPEARANCE/BEHAVIOR Calm and cooperative NEURO ASSESSMENT Orientation: oriented x3  Denies pain Hallucinations: No.None noted (Hallucinations) denies  Speech: Normal Gait: normal RESPIRATORY ASSESSMENT Even  Unlabored respirations  CARDIOVASCULAR ASSESSMENT Pulses equal   regular rate  Skin warm and dry   GASTROINTESTINAL ASSESSMENT no GI complaint EXTREMITIES Full ROM  PLAN OF CARE Provide calm/safe environment. Vital signs assessed twice daily. ED BHU Assessment once each 12-hour shift.  Assure the ED provider has rounded once each shift. Provide and encourage hygiene. Provide redirection as needed. Assess for escalating behavior; address immediately and inform ED provider.  Assess family dynamic and appropriateness for visitation as needed: Yes.  ; If necessary, describe findings:  Educate the patient/family about BHU procedures/visitation: Yes.  ; If necessary, describe findings:

## 2018-09-23 NOTE — ED Notes (Signed)
Hourly rounding reveals patient sleeping in room. No complaints, stable, in no acute distress. Q15 minute rounds and monitoring via Rover and Officer to continue.  

## 2018-09-23 NOTE — ED Notes (Signed)
She has been talking on the telephone - tearful at times  NAD observed  She denies pain  Assessment completed

## 2018-09-23 NOTE — ED Notes (Signed)

## 2018-09-23 NOTE — ED Notes (Addendum)
Pt resting quietly on bed in darkened room with no distress noted; c/o left sided HA 8/10 and requests ibuprofen for pain relief; request made to MD

## 2018-09-23 NOTE — ED Provider Notes (Signed)
-----------------------------------------   4:51 AM on 09/23/2018 ----------------------------------------- Patient presented voluntary for suicidal ideations.  Blood pressure 104/71, pulse 84, temperature (!) 97.5 F (36.4 C), temperature source Oral, resp. rate 18, height 5\' 2"  (1.575 m), weight 66.7 kg, last menstrual period 09/01/2018, SpO2 100 %.  The patient is asleep.  Patient given ibuprofen to help with her headache earlier.  Plan to be admitted downstairs voluntarily.    Vanessa Rutherford, MD 09/23/18 [phone]

## 2018-09-23 NOTE — Consult Note (Addendum)
[facility] Face-to-Face Psychiatry Consult   Reason for Consult:  Suicide attempt Referring Physician:  EDP Patient Identification: Gabriela Carter MRN:  [id number] Principal Diagnosis: MDD (major depressive disorder), recurrent severe, without psychosis ([place]) Diagnosis:  Principal Problem:   MDD (major depressive disorder), recurrent severe, without psychosis ([place]) Active Problems:   Adjustment disorder with mixed disturbance of emotions and conduct   Total Time spent with patient: 30 minutes  Subjective:   [name] is a 38 y.o. female patient who presented to the ER after an attempted suicide by acetaminophen overdose. Patient states that she is experiencing a great deal of pressure from her husband and family now that they have learned of her extramarital affair. Patient has been feeling neglected by her husband, who works as a Fish farm manager, and two months ago initiated a sexual relationship with another man. Patient told her husband that she wanted to leave him but he insisted that they work on fixing and maintaining their marriage. Patient doesn't know whether she wants to stay with her husband or continue her affair. Her suicide attempt was incited primarily by the shame of her children learning about her affair. Today she says that she is feeling much better; she denies suicidal or homicidal ideations. When asked about her plan were her suicidal feelings to return, she stated that they were unlikely to return now that her children, parents, and siblings already now about the affair. When pressed further about a support system in the chance that she feels compelled to hurt herself she says that she feels supported by her boyfriend and her younger sister, Clarise Cruz. Patient denies a any history of mental illness or prior suicidal attempts.   Phone call with husband: Mr Eakes corroborated much of the patients story of yesterday's events. He stated that he confronted his wife about her affair after  the children began to notice their mother's odd behavior and phone calls. Mr Speaker states that his wife has a history of suicidal ideation, explicitly recalling three instances of her threatening to take her own life. Yesterday during their discussion, Mr Eckman saw his wife put several acetaminophen in her mouth and had to physically "embrace her and scoop out the pills." He says that he is concerned that his wife will act on her threats of suicide and has already requested Thursday-Sunday off of his truck driving job to return to [employer] and look after his wife.   Phone call with sister: Clarise Cruz is Ms Ciano' younger sister. She is aware of the circumstances that brought her sister to the hospital and states that her sister has a history of hysterical and "explosive" talk since adolescence. Clarise Cruz says she is personally aware of two prior suicide attempts by her sister, both times she tried to overdose of pills. Clarise Cruz denies any family history of psychiatric diagnoses or family members who have attempted or completed suicide. She would like to be supportive of her sister but is not willing to have the patient stay with her in her home. She believes her sister needs professional psychiatric treatment.   HPI:  per TTS:  38 y.o.femalewho presents to ED with increased depressive sxs and suicidal thoughts. She reports having thoughts to take an overdose of tylenol pills. She reports having martial/infidelity issues that were recently discovered by her husband who then told her family members. Pt states "I have been married for 20 years. For the past 3 years my husband has been working out of town. He only comes home every  8 days. I was telling him I needed his attention. I have practically been taking care of the children by myself. Two months ago I met someone and he became more than a friend. My husband found out about it." Pt became tearful as she explain the recent stressful events. She reports he family started  to disown her because of her infidelity. Pt reports decreased sleep patterns and poor appetite. Pt denied HI/AVH. She was tearful with a flat affect during assessment.  Patient is in good spirits today and in no obvious distress. She spoke about her marital issues clearly and voiced her indecision about how to proceed. Collateral information gained from patient's husband and sister are inconsistent with the patients report concerning her psychiatric history. Patient denies any history of suicidal ideation or attempts but her husband recalled three instances of voiced intent and her sister reported two prior suicide attempts. Both family members we spoke to describe the patient and very emotional with "explosive" reactions and attention seeking behavior. Based on this history, it is our recommendation that the patient be admitted to inpatient psychiatric service  Past Psychiatric History: No hospitalizations, no medications prescribed, reported two previous suicide attempts, no outpatient treatment.  Risk to Self: Suicidal Ideation: Yes-Currently Present Suicidal Intent: Yes-Currently Present Is patient at risk for suicide?: Yes Suicidal Plan?: Yes-Currently Present Specify Current Suicidal Plan: Pt had a plan to overdose on tylenol pills Access to Means: Yes Specify Access to Suicidal Means: Pt has access to OTC pills What has been your use of drugs/alcohol within the last 12 months?: None How many times?: 0 Other Self Harm Risks: None Triggers for Past Attempts: None known Intentional Self Injurious Behavior: None Risk to Others: Homicidal Ideation: No Thoughts of Harm to Others: No Current Homicidal Intent: No Current Homicidal Plan: No Access to Homicidal Means: No Identified Victim: None History of harm to others?: No Assessment of Violence: None Noted Violent Behavior Description: None Does patient have access to weapons?: No Criminal Charges Pending?: No Does patient have a court  date: No Prior Inpatient Therapy: Prior Inpatient Therapy: No Prior Outpatient Therapy: Prior Outpatient Therapy: No Does patient have an ACCT team?: No Does patient have Intensive In-House Services?  : No Does patient have [facility] services? : No Does patient have P4CC services?: No  Past Medical History: History reviewed. No pertinent past medical history.  Past Surgical History:  Procedure Laterality Date  . APPENDECTOMY     Family History: No family history on file. Family Psychiatric  History: None reported Social History:  Social History   Substance and Sexual Activity  Alcohol Use No     Social History   Substance and Sexual Activity  Drug Use Not on file    Social History   Socioeconomic History  . Marital status: Married    Spouse name: Not on file  . Number of children: Not on file  . Years of education: Not on file  . Highest education level: Not on file  Occupational History  . Not on file  Social Needs  . Financial resource strain: Not on file  . Food insecurity    Worry: Not on file    Inability: Not on file  . Transportation needs    Medical: Not on file    Non-medical: Not on file  Tobacco Use  . Smoking status: Never Smoker  . Smokeless tobacco: Never Used  Substance and Sexual Activity  . Alcohol use: No  . Drug use: Not on  file  . Sexual activity: Not on file  Lifestyle  . Physical activity    Days per week: Not on file    Minutes per session: Not on file  . Stress: Not on file  Relationships  . Social Herbalist on phone: Not on file    Gets together: Not on file    Attends religious service: Not on file    Active member of club or organization: Not on file    Attends meetings of clubs or organizations: Not on file    Relationship status: Not on file  Other Topics Concern  . Not on file  Social History Narrative  . Not on file   Additional Social History:    Allergies:   Allergies  Allergen Reactions  . Percocet  [Oxycodone-Acetaminophen]     Labs:  Results for orders placed or performed during the hospital encounter of 09/22/18 (from the past 48 hour(s))  Urine Drug Screen, Qualitative     Status: None   Collection Time: 09/22/18  1:03 PM  Result Value Ref Range   Tricyclic, Ur Screen NONE DETECTED NONE DETECTED   Amphetamines, Ur Screen NONE DETECTED NONE DETECTED   MDMA (Ecstasy)Ur Screen NONE DETECTED NONE DETECTED   Cocaine Metabolite,Ur [place] NONE DETECTED NONE DETECTED   Opiate, Ur Screen NONE DETECTED NONE DETECTED   Phencyclidine (PCP) Ur S NONE DETECTED NONE DETECTED   Cannabinoid 50 Ng, Ur [place] NONE DETECTED NONE DETECTED   Barbiturates, Ur Screen NONE DETECTED NONE DETECTED   Benzodiazepine, Ur Scrn NONE DETECTED NONE DETECTED   Methadone Scn, Ur NONE DETECTED NONE DETECTED    Comment: (NOTE) Tricyclics + metabolites, urine    Cutoff 1000 ng/mL Amphetamines + metabolites, urine  Cutoff 1000 ng/mL MDMA (Ecstasy), urine              Cutoff 500 ng/mL Cocaine Metabolite, urine          Cutoff 300 ng/mL Opiate + metabolites, urine        Cutoff 300 ng/mL Phencyclidine (PCP), urine         Cutoff 25 ng/mL Cannabinoid, urine                 Cutoff 50 ng/mL Barbiturates + metabolites, urine  Cutoff 200 ng/mL Benzodiazepine, urine              Cutoff 200 ng/mL Methadone, urine                   Cutoff 300 ng/mL The urine drug screen provides only a preliminary, unconfirmed analytical test result and should not be used for non-medical purposes. Clinical consideration and professional judgment should be applied to any positive drug screen result due to possible interfering substances. A more specific alternate chemical method must be used in order to obtain a confirmed analytical result. Gas chromatography / mass spectrometry (GC/MS) is the preferred confirmat ory method. Performed at [facility] [address] [place], [place] [address]   Comprehensive metabolic panel      Status: Abnormal   Collection Time: 09/22/18  1:05 PM  Result Value Ref Range   Sodium 141 135 - 145 mmol/L   Potassium 3.8 3.5 - 5.1 mmol/L   Chloride 110 98 - 111 mmol/L   CO2 23 22 - 32 mmol/L   Glucose, Bld 107 (H) 70 - 99 mg/dL   BUN 18 6 - 20 mg/dL   Creatinine, Ser 0.73 0.44 - 1.00 mg/dL  Calcium 9.2 8.9 - 10.3 mg/dL   Total Protein 8.2 (H) 6.5 - 8.1 g/dL   Albumin 4.5 3.5 - 5.0 g/dL   AST 21 15 - 41 U/L   ALT 19 0 - 44 U/L   Alkaline Phosphatase 97 38 - 126 U/L   Total Bilirubin 0.8 0.3 - 1.2 mg/dL   GFR calc non Af Amer >60 >60 mL/min   GFR calc Af Amer >60 >60 mL/min   Anion gap 8 5 - 15    Comment: Performed at [facility], [address] [place], [place] [address]  Ethanol     Status: None   Collection Time: 09/22/18  1:05 PM  Result Value Ref Range   Alcohol, Ethyl (B) <10 <10 mg/dL    Comment: (NOTE) Lowest detectable limit for serum alcohol is 10 mg/dL. For medical purposes only. Performed at [facility], [address] [place], [place] [address]   Salicylate level     Status: None   Collection Time: 09/22/18  1:05 PM  Result Value Ref Range   Salicylate Lvl <0.9 2.8 - 30.0 mg/dL    Comment: Performed at [facility], [address] [place], [place] [address]  Acetaminophen level     Status: Abnormal   Collection Time: 09/22/18  1:05 PM  Result Value Ref Range   Acetaminophen (Tylenol), Serum <10 (L) 10 - 30 ug/mL    Comment: (NOTE) Therapeutic concentrations vary significantly. A range of 10-30 ug/mL  may be an effective concentration for many patients. However, some  are best treated at concentrations outside of this range. Acetaminophen concentrations >150 ug/mL at 4 hours after ingestion  and >50 ug/mL at 12 hours after ingestion are often associated with  toxic reactions. Performed at [facility], [address] [place], [place] [address]   cbc     Status: Abnormal   Collection Time: 09/22/18  1:05  PM  Result Value Ref Range   WBC 7.3 4.0 - 10.5 K/uL   RBC 5.12 (H) 3.87 - 5.11 MIL/uL   Hemoglobin 13.9 12.0 - 15.0 g/dL   HCT 43.6 36.0 - 46.0 %   MCV 85.2 80.0 - 100.0 fL   MCH 27.1 26.0 - 34.0 pg   MCHC 31.9 30.0 - 36.0 g/dL   RDW 14.8 11.5 - 15.5 %   Platelets 289 150 - 400 K/uL   nRBC 0.0 0.0 - 0.2 %    Comment: Performed at [facility], [address] [place], [place] [address]  Pregnancy, urine POC     Status: None   Collection Time: 09/22/18  1:56 PM  Result Value Ref Range   Preg Test, Ur NEGATIVE NEGATIVE    Comment:        THE SENSITIVITY OF THIS METHODOLOGY IS >24 mIU/mL   Acetaminophen level     Status: Abnormal   Collection Time: 09/22/18  3:00 PM  Result Value Ref Range   Acetaminophen (Tylenol), Serum <10 (L) 10 - 30 ug/mL    Comment: (NOTE) Therapeutic concentrations vary significantly. A range of 10-30 ug/mL  may be an effective concentration for many patients. However, some  are best treated at concentrations outside of this range. Acetaminophen concentrations >150 ug/mL at 4 hours after ingestion  and >50 ug/mL at 12 hours after ingestion are often associated with  toxic reactions. Performed at [facility], [address] [place],  [address]   SARS Coronavirus 2 [facility] order, Performed in [facility] hospital  lab) Nasopharyngeal Nasopharyngeal Swab     Status: None   Collection Time: 09/22/18  6:01 PM   Specimen: Nasopharyngeal Swab  Result Value Ref Range   SARS Coronavirus 2 NEGATIVE NEGATIVE    Comment: (NOTE) If result is NEGATIVE SARS-CoV-2 target nucleic acids are NOT DETECTED. The SARS-CoV-2 RNA is generally detectable in upper and lower  respiratory specimens during the acute phase of infection. The lowest  concentration of SARS-CoV-2 viral copies this assay can detect is 250  copies / mL. A negative result does not preclude SARS-CoV-2 infection  and should not be used as the sole basis for treatment or  other  patient management decisions.  A negative result may occur with  improper specimen collection / handling, submission of specimen other  than nasopharyngeal swab, presence of viral mutation(s) within the  areas targeted by this assay, and inadequate number of viral copies  (<250 copies / mL). A negative result must be combined with clinical  observations, patient history, and epidemiological information. If result is POSITIVE SARS-CoV-2 target nucleic acids are DETECTED. The SARS-CoV-2 RNA is generally detectable in upper and lower  respiratory specimens dur ing the acute phase of infection.  Positive  results are indicative of active infection with SARS-CoV-2.  Clinical  correlation with patient history and other diagnostic information is  necessary to determine patient infection status.  Positive results do  not rule out bacterial infection or co-infection with other viruses. If result is PRESUMPTIVE POSTIVE SARS-CoV-2 nucleic acids MAY BE PRESENT.   A presumptive positive result was obtained on the submitted specimen  and confirmed on repeat testing.  While 2019 novel coronavirus  (SARS-CoV-2) nucleic acids may be present in the submitted sample  additional confirmatory testing may be necessary for epidemiological  and / or clinical management purposes  to differentiate between  SARS-CoV-2 and other Sarbecovirus currently known to infect humans.  If clinically indicated additional testing with an alternate test  methodology [phone]) is advised. The SARS-CoV-2 RNA is generally  detectable in upper and lower respiratory sp ecimens during the acute  phase of infection. The expected result is Negative. Fact Sheet for Patients:  StrictlyIdeas.no Fact Sheet for Healthcare Providers: BankingDealers.co.za This test is not yet approved or cleared by the [place] FDA and has been authorized for detection and/or diagnosis of  SARS-CoV-2 by FDA under an Emergency Use Authorization (EUA).  This EUA will remain in effect (meaning this test can be used) for the duration of the COVID-19 declaration under Section 564(b)(1) of the Act, 21 U.S.C. section 360bbb-3(b)(1), unless the authorization is terminated or revoked sooner. Performed at [facility], [address] [place], [place] [address]     No current facility-administered medications for this encounter.    Current Outpatient Medications  Medication Sig Dispense Refill  . Multiple Vitamin (MULTIVITAMIN WITH MINERALS) TABS tablet Take 1 tablet by mouth daily.      Musculoskeletal: Strength & Muscle Tone: within normal limits Gait & Station: normal Patient leans: N/A  Psychiatric Specialty Exam: Physical Exam  Nursing note and vitals reviewed. Constitutional: She is oriented to person, place, and time. She appears well-developed and well-nourished.  Cardiovascular: Normal rate.  Respiratory: Effort normal.  Musculoskeletal: Normal range of motion.  Neurological: She is alert and oriented to person, place, and time.    Review of Systems  Constitutional: Negative.   HENT: Negative.   Eyes: Negative.   Respiratory: Negative.   Cardiovascular: Negative.   Gastrointestinal: Negative.   Genitourinary:  Negative.   Musculoskeletal: Negative.   Skin: Negative.   Neurological: Negative.   Endo/Heme/Allergies: Negative.   Psychiatric/Behavioral: Negative.     Blood pressure 111/72, pulse 82, temperature 97.9 F (36.6 C), temperature source Oral, resp. rate 17, height '5\' 2"'$  (1.575 m), weight 66.7 kg, last menstrual period 09/01/2018, SpO2 99 %.Body mass index is 26.89 kg/m.  General Appearance: Casual  Eye Contact:  Good  Speech:  Clear and Coherent  Volume:  Normal  Mood:  Euthymic  Affect:  Appropriate  Thought Process:  Coherent and Descriptions of Associations: Intact  Orientation:  Full (Time, Place, and Person)  Thought Content:   WDL  Suicidal Thoughts:  No  Homicidal Thoughts:  No  Memory:  Immediate;   Good Recent;   Good Remote;   Fair  Judgement:  Fair  Insight:  Lacking  Psychomotor Activity:  Normal  Concentration:  Concentration: Good  Recall:  Good  Fund of Knowledge:  Good  Language:  Good  Akathisia:  No  Handed:  Right  AIMS (if indicated):     Assets:  Communication Skills Housing Physical Health Social Support Transportation  ADL's:  Intact  Cognition:  WNL  Sleep:        Treatment Plan Summary: Daily contact with patient to assess and evaluate symptoms and progress in treatment and Medication management  Disposition: Recommend psychiatric Inpatient admission when medically cleared.  [place], FNP 09/23/2018 6:18 PM

## 2018-09-23 NOTE — ED Notes (Signed)
Patient is awake and alert with NAD. VS are stable. She is transferred to [facility] via [facility]. Belongings transferred with patient to BMU. Report given to [facility]. No issues.

## 2018-09-23 NOTE — Tx Team (Signed)
Initial Treatment Plan 09/23/2018 11:48 PM [name] [id number]    PATIENT STRESSORS: Marital or family conflict   PATIENT STRENGTHS: Capable of independent living Supportive family/friends   PATIENT IDENTIFIED PROBLEMS: Depression  Anxiety                   DISCHARGE CRITERIA:  Adequate post-discharge living arrangements Improved stabilization in mood, thinking, and/or behavior  PRELIMINARY DISCHARGE PLAN: Outpatient therapy Return to previous living arrangement  PATIENT/FAMILY INVOLVEMENT: This treatment plan has been presented to and reviewed with the patient, Gabriela Carter, and/or family member . The patient and family have been given the opportunity to ask questions and make suggestions.  Isabella Bowens, RN 09/23/2018, 11:48 PM

## 2018-09-23 NOTE — ED Notes (Signed)
Patient observed lying in bed with eyes closed  Even, unlabored respirations observed   NAD pt appears to be sleeping  I will continue to monitor along with every 15 minute visual observations and ongoing security monitoring    

## 2018-09-23 NOTE — Progress Notes (Signed)
Patient admitted from the quad upstairs due to Depression related to suicidal thoughts to overdose due to marital conflict. She is voluntarily and has been cooperative with treatment thus far upon admission to BMU unit. She is Ox4, remains depressed , she denies halluciantions but she is able to contract for safety at this time.

## 2018-09-24 DIAGNOSIS — R45851 Suicidal ideations: Secondary | ICD-10-CM

## 2018-09-24 DIAGNOSIS — F4325 Adjustment disorder with mixed disturbance of emotions and conduct: Principal | ICD-10-CM

## 2018-09-24 NOTE — Plan of Care (Signed)
Pt was educated on care pland and verbalizes understanding. Collier Bullock RN Problem: Education: Goal: Freight forwarder Education information/materials will improve Outcome: Progressing Goal: Emotional status will improve Outcome: Progressing Goal: Mental status will improve Outcome: Progressing Goal: Verbalization of understanding the information provided will improve Outcome: Progressing   Problem: Safety: Goal: Periods of time without injury will increase Outcome: Progressing   Problem: Education: Goal: Ability to make informed decisions regarding treatment will improve Outcome: Progressing   Problem: Self-Concept: Goal: Ability to disclose and discuss suicidal ideas will improve Outcome: Progressing

## 2018-09-24 NOTE — BHH Suicide Risk Assessment (Signed)
[facility] Admission Suicide Risk Assessment   Nursing information obtained from:  Patient Demographic factors:  NA Current Mental Status:  NA Loss Factors:  NA Historical Factors:  NA Risk Reduction Factors:  NA  Total Time spent with patient: 1 hour Principal Problem: Adjustment disorder with mixed disturbance of emotions and conduct Diagnosis:  Principal Problem:   Adjustment disorder with mixed disturbance of emotions and conduct Active Problems:   Suicidal ideation  Subjective Data: Patient seen chart reviewed.  Patient interviewed with the assistance of a Spanish language interpreter.  Patient came to the hospital after impulsively attempting to take an overdose of acetaminophen.  She did not actually swallow any of the pills.  Patient reports she was under extreme stress from her son finding out about some of the marital problems she and her husband were having.  Today the patient says she is still feeling disappointed about the situation but denies suicidal thoughts denies psychotic symptoms.  Not intoxicated denies alcohol or drug abuse.  Continued Clinical Symptoms:  Alcohol Use Disorder Identification Test Final Score (AUDIT): 0 The "Alcohol Use Disorders Identification Test", Guidelines for Use in Primary Care, Second Edition.  World Pharmacologist [facility]). Score between 0-7:  no or low risk or alcohol related problems. Score between 8-15:  moderate risk of alcohol related problems. Score between 16-19:  high risk of alcohol related problems. Score 20 or above:  warrants further diagnostic evaluation for alcohol dependence and treatment.   CLINICAL FACTORS:   Depression:   Impulsivity   Musculoskeletal: Strength & Muscle Tone: within normal limits Gait & Station: normal Patient leans: N/A  Psychiatric Specialty Exam: Physical Exam  Nursing note and vitals reviewed. Constitutional: She appears well-developed and well-nourished.  HENT:  Head: Normocephalic and  atraumatic.  Eyes: Pupils are equal, round, and reactive to light. Conjunctivae are normal.  Neck: Normal range of motion.  Cardiovascular: Regular rhythm and normal heart sounds.  Respiratory: Effort normal.  GI: Soft.  Musculoskeletal: Normal range of motion.  Neurological: She is alert.  Skin: Skin is warm and dry.  Psychiatric: She has a normal mood and affect. Her behavior is normal. Judgment and thought content normal.    Review of Systems  Constitutional: Negative.   HENT: Negative.   Eyes: Negative.   Respiratory: Negative.   Cardiovascular: Negative.   Gastrointestinal: Negative.   Musculoskeletal: Negative.   Skin: Negative.   Neurological: Negative.   Psychiatric/Behavioral: Negative for depression, hallucinations, memory loss, substance abuse and suicidal ideas. The patient is not nervous/anxious and does not have insomnia.     Blood pressure 110/81, pulse 77, temperature 97.7 F (36.5 C), temperature source Oral, resp. rate 18, height 5\' 2"  (1.575 m), weight 66.7 kg, last menstrual period 09/01/2018, SpO2 100 %.Body mass index is 26.89 kg/m.  General Appearance: Casual  Eye Contact:  Good  Speech:  Clear and Coherent  Volume:  Normal  Mood:  Dysphoric  Affect:  Congruent  Thought Process:  Goal Directed  Orientation:  Full (Time, Place, and Person)  Thought Content:  Logical  Suicidal Thoughts:  No  Homicidal Thoughts:  No  Memory:  Immediate;   Fair Recent;   Fair Remote;   Fair  Judgement:  Fair  Insight:  Fair  Psychomotor Activity:  Normal  Concentration:  Concentration: Fair  Recall:  [employer] of Knowledge:  Fair  Language:  Fair  Akathisia:  No  Handed:  Right  AIMS (if indicated):     Assets:  Desire  for Improvement Financial Resources/Insurance Housing Social Support  ADL's:  Intact  Cognition:  WNL  Sleep:  Number of Hours: 5      COGNITIVE FEATURES THAT CONTRIBUTE TO RISK:  None    SUICIDE RISK:   Minimal: No identifiable  suicidal ideation.  Patients presenting with no risk factors but with morbid ruminations; may be classified as minimal risk based on the severity of the depressive symptoms  PLAN OF CARE: Patient seen chart reviewed.  Patient is currently denying any suicidal thoughts.  Affect somewhat dysphoric but reactive.  She is cooperative with treatment here in the hospital.  She is agreeable to a possible therapy outside the hospital.  Continue observation and evaluation by treatment team.  Probable discharge within a day.  I certify that inpatient services furnished can reasonably be expected to improve the patient's condition.   Mordecai RasmussenJohn Tyeshia Cornforth, MD 09/24/2018, 4:56 PM
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
## 2018-09-24 NOTE — BHH Counselor (Signed)
CSW utilized the interpreter service Cori Razor [phone] to help patient complete the form identifying who she would like for her visitor to be.  Assunta Curtis, MSW, LCSW 09/24/2018 3:05 PM
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
## 2018-09-24 NOTE — Tx Team (Addendum)
Interdisciplinary Treatment and Diagnostic Plan Update  09/24/2018 Time of Session: [place] MRN: [id number]  Principal Diagnosis: <principal problem not specified>  Secondary Diagnoses: Active Problems:   MDD (major depressive disorder), recurrent severe, without psychosis ([place])   Current Medications:  Current Facility-Administered Medications  Medication Dose Route Frequency Provider Last Rate Last Dose  . alum & mag hydroxide-simeth (MAALOX/MYLANTA) 200-200-20 MG/5ML suspension 30 mL  30 mL Oral Q4H PRN Money, Lowry Ram, FNP      . hydrOXYzine (ATARAX/VISTARIL) tablet 25 mg  25 mg Oral TID PRN Money, Lowry Ram, FNP      . ibuprofen (ADVIL) tablet 400 mg  400 mg Oral Q8H PRN Money, Lowry Ram, FNP      . magnesium hydroxide (MILK OF MAGNESIA) suspension 30 mL  30 mL Oral Daily PRN Money, Lowry Ram, FNP      . traZODone (DESYREL) tablet 50 mg  50 mg Oral QHS PRN Money, Lowry Ram, FNP       PTA Medications: Medications Prior to Admission  Medication Sig Dispense Refill Last Dose  . Multiple Vitamin (MULTIVITAMIN WITH MINERALS) TABS tablet Take 1 tablet by mouth daily.   09/21/2018 at unknown    Patient Stressors: Marital or family conflict  Patient Strengths: Capable of independent living Supportive family/friends  Treatment Modalities: Medication Management, Group therapy, Case management,  1 to 1 session with clinician, Psychoeducation, Recreational therapy.   Physician Treatment Plan for Primary Diagnosis: <principal problem not specified> Long Term Goal(s):     Short Term Goals:    Medication Management: Evaluate patient's response, side effects, and tolerance of medication regimen.  Therapeutic Interventions: 1 to 1 sessions, Unit Group sessions and Medication administration.  Evaluation of Outcomes: Progressing  Physician Treatment Plan for Secondary Diagnosis: Active Problems:   MDD (major depressive disorder), recurrent severe, without psychosis ([place])  Long  Term Goal(s):     Short Term Goals:       Medication Management: Evaluate patient's response, side effects, and tolerance of medication regimen.  Therapeutic Interventions: 1 to 1 sessions, Unit Group sessions and Medication administration.  Evaluation of Outcomes: Progressing   RN Treatment Plan for Primary Diagnosis: <principal problem not specified> Long Term Goal(s): Knowledge of disease and therapeutic regimen to maintain health will improve  Short Term Goals: Ability to verbalize feelings will improve and Ability to identify and develop effective coping behaviors will improve  Medication Management: RN will administer medications as ordered by provider, will assess and evaluate patient's response and provide education to patient for prescribed medication. RN will report any adverse and/or side effects to prescribing provider.  Therapeutic Interventions: 1 on 1 counseling sessions, Psychoeducation, Medication administration, Evaluate responses to treatment, Monitor vital signs and CBGs as ordered, Perform/monitor CIWA, COWS, AIMS and Fall Risk screenings as ordered, Perform wound care treatments as ordered.  Evaluation of Outcomes: Progressing   LCSW Treatment Plan for Primary Diagnosis: <principal problem not specified> Long Term Goal(s): Safe transition to appropriate next level of care at discharge, Engage patient in therapeutic group addressing interpersonal concerns.  Short Term Goals: Engage patient in aftercare planning with referrals and resources, Increase emotional regulation and Increase skills for wellness and recovery  Therapeutic Interventions: Assess for all discharge needs, 1 to 1 time with Social worker, Explore available resources and support systems, Assess for adequacy in community support network, Educate family and significant other(s) on suicide prevention, Complete Psychosocial Assessment, Interpersonal group therapy.  Evaluation of Outcomes:  Progressing   Progress in Treatment:  Attending groups: No. Participating in groups: No. Taking medication as prescribed: Yes. Toleration medication: Yes. Family/Significant other contact made: Yes, individual(s) contacted:  pts husband Patient understands diagnosis: Yes. Discussing patient identified problems/goals with staff: Yes. Medical problems stabilized or resolved: Yes. Denies suicidal/homicidal ideation: Yes. Issues/concerns per patient self-inventory: No. Other: N/A  New problem(s) identified: No, Describe:  none  New Short Term/Long Term Goal(s): elimination of AVH/symptoms of psychosis, medication management for mood stabilization; elimination of SI thoughts; development of comprehensive mental wellness/sobriety plan.   Patient Goals:  "I don't have any goals"  Discharge Plan or Barriers: SPE pamphlet, Mobile Crisis information, and AA/NA information provided to patient for additional community support and resources. Pt has been referred to [facility] [place], they will follow up with pt directly.  Reason for Continuation of Hospitalization: Medication stabilization  Estimated Length of Stay: Tomorrow 09/25/2018   Recreational Therapy: Patient: N/A Patient Goal: Patient will engage in groups without prompting or encouragement from LRT x3 group sessions within 5 recreation therapy group sessions  Attendees: Patient: Robyne Peersndira Viti 09/24/2018 3:18 PM  Physician: Dr Toni Amendlapacs MD 09/24/2018 3:18 PM  Nursing:  09/24/2018 3:18 PM  RN Care Manager: 09/24/2018 3:18 PM  Social Worker: Zollie Scalelivia Moton LCSW 09/24/2018 3:18 PM  Recreational Therapist: Garret ReddishShay Najae Rathert CTRS LRT 09/24/2018 3:18 PM  Other: Penni HomansMichaela Stanfield LCSW 09/24/2018 3:18 PM  Other:  09/24/2018 3:18 PM  Other: 09/24/2018 3:18 PM    Scribe for Treatment Team: Charlann Langelivia K Moton, LCSW 09/24/2018 3:18 PM

## 2018-09-24 NOTE — H&P (Signed)
Psychiatric Admission Assessment Adult  Patient Identification: Gabriela Carter MRN:  [id number] Date of Evaluation:  09/24/2018 Chief Complaint:  major depressive Principal Diagnosis: Adjustment disorder with mixed disturbance of emotions and conduct Diagnosis:  Principal Problem:   Adjustment disorder with mixed disturbance of emotions and conduct Active Problems:   Suicidal ideation  History of Present Illness: Patient seen and chart reviewed.  Patient was interviewed with the assistance of a Spanish language interpreter.  Patient came into the emergency room after attempting to take an overdose of acetaminophen.  Patient reports that she and her husband have been going through a rough time.  She had been involved in an extramarital affair recently.  Husband found out just a few days ago.  They have been in the midst of working it out but it was her understanding that they had agreed not to let the children know about it.  Husband blurted out the situation to 39 year old son.  Patient became angry and ashamed and grabbed some pills.  Family stopped her from swallowing anything.  Patient states that prior to that she had been feeling bad in an expected way since her husband found out but had not been feeling down and sad or depressed regularly.  She had not been having problems with sleep or appetite.  Denies any ongoing suicidal thoughts or intention.  Denies any hallucinations.  Not using alcohol or drugs.  Not receiving any mental health treatment. Associated Signs/Symptoms: Depression Symptoms:  feelings of worthlessness/guilt, anxiety, (Hypo) Manic Symptoms:  None Anxiety Symptoms:  Specific anxiety about the current situation Psychotic Symptoms:  None PTSD Symptoms: Negative Total Time spent with patient: 1 hour  Past Psychiatric History: Patient has no past psychiatric history.  Never seen a psychiatrist mental health provider in any form.  Never been prescribed any psychiatric medicine.   No previous hospitalization.  No history of suicide attempts.  Husband reports that the patient has made threats vaguely about suicide during arguments in the past but there are no previous suicide attempts or psychiatric treatment.  Is the patient at risk to self? No.  Has the patient been a risk to self in the past 6 months? No.  Has the patient been a risk to self within the distant past? No.  Is the patient a risk to others? No.  Has the patient been a risk to others in the past 6 months? No.  Has the patient been a risk to others within the distant past? No.   Prior Inpatient Therapy:   Prior Outpatient Therapy:    Alcohol Screening: 1. How often do you have a drink containing alcohol?: Never 2. How many drinks containing alcohol do you have on a typical day when you are drinking?: 1 or 2 3. How often do you have six or more drinks on one occasion?: Never AUDIT-C Score: 0 4. How often during the last year have you found that you were not able to stop drinking once you had started?: Never 5. How often during the last year have you failed to do what was normally expected from you becasue of drinking?: Never 6. How often during the last year have you needed a first drink in the morning to get yourself going after a heavy drinking session?: Never 7. How often during the last year have you had a feeling of guilt of remorse after drinking?: Never 8. How often during the last year have you been unable to remember what happened the night before because you had  been drinking?: Never 9. Have you or someone else been injured as a result of your drinking?: No 10. Has a relative or friend or a doctor or another health worker been concerned about your drinking or suggested you cut down?: No Alcohol Use Disorder Identification Test Final Score (AUDIT): 0 Alcohol Brief Interventions/Follow-up: AUDIT Score <7 follow-up not indicated Substance Abuse History in the last 12 months:  No. Consequences of  Substance Abuse: Negative Previous Psychotropic Medications: No  Psychological Evaluations: No  Past Medical History: History reviewed. No pertinent past medical history.  Past Surgical History:  Procedure Laterality Date  . APPENDECTOMY     Family History: History reviewed. No pertinent family history. Family Psychiatric  History: Denies any Tobacco Screening: Have you used any form of tobacco in the last 30 days? (Cigarettes, Smokeless Tobacco, Cigars, and/or Pipes): No Social History:  Social History   Substance and Sexual Activity  Alcohol Use No     Social History   Substance and Sexual Activity  Drug Use Not on file    Additional Social History: Marital status: Married Number of Years Married: 20 What types of issues is patient dealing with in the relationship?: Pt reports that "I started dating someone and my oldest son found out." Are you sexually active?: Yes What is your sexual orientation?: Heterosexual Has your sexual activity been affected by drugs, alcohol, medication, or emotional stress?: Pt denies. Does patient have children?: Yes How many children?: 4 How is patient's relationship with their children?: Pt reports "I've taken care of my children so it's great."                         Allergies:   Allergies  Allergen Reactions  . Percocet [Oxycodone-Acetaminophen]    Lab Results:  Results for orders placed or performed during the hospital encounter of 09/22/18 (from the past 48 hour(s))  SARS Coronavirus 2 [facility](Hospital order, [facility]) Nasopharyngeal Nasopharyngeal Swab     Status: None   Collection Time: 09/22/18  6:01 PM   Specimen: Nasopharyngeal Swab  Result Value Ref Range   SARS Coronavirus 2 NEGATIVE NEGATIVE    Comment: (NOTE) If result is NEGATIVE SARS-CoV-2 target nucleic acids are NOT DETECTED. The SARS-CoV-2 RNA is generally detectable in upper and lower  respiratory specimens during the acute phase of  infection. The lowest  concentration of SARS-CoV-2 viral copies this assay can detect is 250  copies / mL. A negative result does not preclude SARS-CoV-2 infection  and should not be used as the sole basis for treatment or other  patient management decisions.  A negative result may occur with  improper specimen collection / handling, submission of specimen other  than nasopharyngeal swab, presence of viral mutation(s) within the  areas targeted by this assay, and inadequate number of viral copies  (<250 copies / mL). A negative result must be combined with clinical  observations, patient history, and epidemiological information. If result is POSITIVE SARS-CoV-2 target nucleic acids are DETECTED. The SARS-CoV-2 RNA is generally detectable in upper and lower  respiratory specimens dur ing the acute phase of infection.  Positive  results are indicative of active infection with SARS-CoV-2.  Clinical  correlation with patient history and other diagnostic information is  necessary to determine patient infection status.  Positive results do  not rule out bacterial infection or co-infection with other viruses. If result is PRESUMPTIVE POSTIVE SARS-CoV-2 nucleic acids MAY BE PRESENT.  A presumptive positive result was obtained on the submitted specimen  and confirmed on repeat testing.  While 2019 novel coronavirus  (SARS-CoV-2) nucleic acids may be present in the submitted sample  additional confirmatory testing may be necessary for epidemiological  and / or clinical management purposes  to differentiate between  SARS-CoV-2 and other Sarbecovirus currently known to infect humans.  If clinically indicated additional testing with an alternate test  methodology [phone]) is advised. The SARS-CoV-2 RNA is generally  detectable in upper and lower respiratory sp ecimens during the acute  phase of infection. The expected result is Negative. Fact Sheet for Patients:   StrictlyIdeas.no Fact Sheet for Healthcare Providers: BankingDealers.co.za This test is not yet approved or cleared by the [place] FDA and has been authorized for detection and/or diagnosis of SARS-CoV-2 by FDA under an Emergency Use Authorization (EUA).  This EUA will remain in effect (meaning this test can be used) for the duration of the COVID-19 declaration under Section 564(b)(1) of the Act, 21 U.S.C. section 360bbb-3(b)(1), unless the authorization is terminated or revoked sooner. Performed at [facility], [address] [place], [place] [address]     Blood Alcohol level:  Lab Results  Component Value Date   [facility] <10 77/82/4235    Metabolic Disorder Labs:  No results found for: HGBA1C, MPG No results found for: PROLACTIN No results found for: CHOL, TRIG, HDL, CHOLHDL, VLDL, LDLCALC  Current Medications: Current Facility-Administered Medications  Medication Dose Route Frequency Provider Last Rate Last Dose  . alum & mag hydroxide-simeth (MAALOX/MYLANTA) 200-200-20 MG/5ML suspension 30 mL  30 mL Oral Q4H PRN Money, Lowry Ram, FNP      . hydrOXYzine (ATARAX/VISTARIL) tablet 25 mg  25 mg Oral TID PRN Money, Lowry Ram, FNP      . ibuprofen (ADVIL) tablet 400 mg  400 mg Oral Q8H PRN Money, Lowry Ram, FNP      . magnesium hydroxide (MILK OF MAGNESIA) suspension 30 mL  30 mL Oral Daily PRN Money, Lowry Ram, FNP      . traZODone (DESYREL) tablet 50 mg  50 mg Oral QHS PRN Money, Lowry Ram, FNP       PTA Medications: Medications Prior to Admission  Medication Sig Dispense Refill Last Dose  . Multiple Vitamin (MULTIVITAMIN WITH MINERALS) TABS tablet Take 1 tablet by mouth daily.   09/21/2018 at unknown    Musculoskeletal: Strength & Muscle Tone: within normal limits Gait & Station: normal Patient leans: N/A  Psychiatric Specialty Exam: Physical Exam  Nursing note and vitals reviewed. Constitutional: She appears  well-developed and well-nourished.  HENT:  Head: Normocephalic and atraumatic.  Eyes: Pupils are equal, round, and reactive to light. Conjunctivae are normal.  Neck: Normal range of motion.  Cardiovascular: Regular rhythm and normal heart sounds.  Respiratory: Effort normal.  GI: Soft.  Musculoskeletal: Normal range of motion.  Neurological: She is alert.  Skin: Skin is warm and dry.  Psychiatric: Her behavior is normal. Judgment and thought content normal. Her affect is blunt.    Review of Systems  Constitutional: Negative.   HENT: Negative.   Eyes: Negative.   Respiratory: Negative.   Cardiovascular: Negative.   Gastrointestinal: Negative.   Musculoskeletal: Negative.   Skin: Negative.   Neurological: Negative.   Psychiatric/Behavioral: Positive for depression. Negative for hallucinations, memory loss, substance abuse and suicidal ideas. The patient is not nervous/anxious and does not have insomnia.     Blood pressure 110/81, pulse 77, temperature 97.7 F (36.5 C),  temperature source Oral, resp. rate 18, height 5\' 2"  (1.575 m), weight 66.7 kg, last menstrual period 09/01/2018, SpO2 100 %.Body mass index is 26.89 kg/m.  General Appearance: Casual  Eye Contact:  Good  Speech:  Clear and Coherent  Volume:  Normal  Mood:  Dysphoric  Affect:  Constricted  Thought Process:  Coherent  Orientation:  Full (Time, Place, and Person)  Thought Content:  Logical  Suicidal Thoughts:  No  Homicidal Thoughts:  No  Memory:  Immediate;   Fair Recent;   Fair Remote;   Fair  Judgement:  Fair  Insight:  Fair  Psychomotor Activity:  Normal  Concentration:  Concentration: Fair  Recall:  [employer]  Fund of Knowledge:  Fair  Language:  Fair  Akathisia:  No  Handed:  Right  AIMS (if indicated):     Assets:  Desire for Improvement  ADL's:  Intact  Cognition:  WNL  Sleep:  Number of Hours: 5    Treatment Plan Summary: Plan Patient and I reviewed her symptoms and history.  There does not  appear to be a history of major depression or a clear indication for psychiatric medicine.  Agrees to the plan to not start medication.  Psychoeducation done and she will be referred for outpatient therapy if we can locate Spanish language resources.  No new medical problems.  Continue 15-minute checks likely discharge tomorrow.  Observation Level/Precautions:  15 minute checks  Laboratory:  Chemistry Profile  Psychotherapy:    Medications:    Consultations:    Discharge Concerns:    Estimated LOS:  Other:     Physician Treatment Plan for Primary Diagnosis: Adjustment disorder with mixed disturbance of emotions and conduct Long Term Goal(s): Improvement in symptoms so as ready for discharge  Short Term Goals: Ability to verbalize feelings will improve  Physician Treatment Plan for Secondary Diagnosis: Principal Problem:   Adjustment disorder with mixed disturbance of emotions and conduct Active Problems:   Suicidal ideation  Long Term Goal(s): Improvement in symptoms so as ready for discharge  Short Term Goals: Ability to maintain clinical measurements within normal limits will improve  I certify that inpatient services furnished can reasonably be expected to improve the patient's condition.    Mordecai RasmussenJohn Jackie Littlejohn, MD 8/12/20205:00 PM

## 2018-09-24 NOTE — Progress Notes (Signed)
Recreation Therapy Notes  Date: 04/24/2018  Time: 9:30 am   Location: Craft room   Behavioral response: N/A   Intervention Topic: Goals  Discussion/Intervention: Patient did not attend group.   Clinical Observations/Feedback:  Patient did not attend group.   Rynn Markiewicz LRT/CTRS        Berkleigh Beckles 09/24/2018 12:21 PM

## 2018-09-24 NOTE — BHH Group Notes (Signed)
LCSW Group Therapy Note  09/24/2018 1:00 PM  Type of Therapy/Topic:  Group Therapy:  Emotion Regulation  Participation Level:  Did Not Attend   Description of Group:   The purpose of this group is to assist patients in learning to regulate negative emotions and experience positive emotions. Patients will be guided to discuss ways in which they have been vulnerable to their negative emotions. These vulnerabilities will be juxtaposed with experiences of positive emotions or situations, and patients will be challenged to use positive emotions to combat negative ones. Special emphasis will be placed on coping with negative emotions in conflict situations, and patients will process healthy conflict resolution skills.  Therapeutic Goals: 1. Patient will identify two positive emotions or experiences to reflect on in order to balance out negative emotions 2. Patient will label two or more emotions that they find the most difficult to experience 3. Patient will demonstrate positive conflict resolution skills through discussion and/or role plays  Summary of Patient Progress: X  Therapeutic Modalities:   Cognitive Behavioral Therapy Feelings Identification Dialectical Behavioral Therapy  Cortez Steelman, MSW, LCSW 09/24/2018 3:03 PM  

## 2018-09-25 NOTE — Progress Notes (Signed)
Patient alert and oriented x 4, affect is flat but she brightens upon approach, Probation officer communicated with assistance of spanish interpreter, she appears overly excited , euphotic, interacting with some spanish speaking peers loudly on the unit. Patient currently denies SI/HI/AVH , her thoughts are organized and coherent, no distress noted. 15 minutes safety checks maintained will continue to monitor.

## 2018-09-25 NOTE — Plan of Care (Signed)
  Problem: Education: Goal: Verbalization of understanding the information provided will improve Outcome: Progressing  Patient verbalized through interpreter she understood all information provided to her.

## 2018-09-25 NOTE — Progress Notes (Signed)
  [facility] Adult Case Management Discharge Plan :  Will you be returning to the same living situation after discharge:  Yes,  lives with spouse and children At discharge, do you have transportation home?: Yes,  Leo(son) will pick up at 10am Do you have the ability to pay for your medications: No.  Release of information consent forms completed and in the chart;  Patient's signature needed at discharge.  Patient to Follow up at: Follow-up [facility] Follow up.   Specialty: Behavioral Health Why: You have been added to the waiting list, they will contact you when they can schedule you for services. If you have any questions you can contact [facility] directly. Thank You! Contact information: [place] [address] [phone]           Next level of care provider has access to [place] and Suicide Prevention discussed: Yes,  Noura Purpura, husband  Have you used any form of tobacco in the last 30 days? (Cigarettes, Smokeless Tobacco, Cigars, and/or Pipes): No  Has patient been referred to the Quitline?: N/A patient is not a smoker  Patient has been referred for addiction treatment: [place], LCSW 09/25/2018, 9:42 AM

## 2018-09-25 NOTE — BHH Suicide Risk Assessment (Signed)
[facility] Discharge Suicide Risk Assessment   Principal Problem: Adjustment disorder with mixed disturbance of emotions and conduct Discharge Diagnoses: Principal Problem:   Adjustment disorder with mixed disturbance of emotions and conduct Active Problems:   Suicidal ideation   Total Time spent with patient: 45 minutes  Musculoskeletal: Strength & Muscle Tone: within normal limits Gait & Station: normal Patient leans: N/A  Psychiatric Specialty Exam: Review of Systems  Constitutional: Negative.   HENT: Negative.   Eyes: Negative.   Respiratory: Negative.   Cardiovascular: Negative.   Gastrointestinal: Negative.   Musculoskeletal: Negative.   Skin: Negative.   Neurological: Negative.   Psychiatric/Behavioral: Negative.     Blood pressure 107/71, pulse 86, temperature 97.6 F (36.4 C), temperature source Oral, resp. rate 18, height 5\' 2"  (1.575 m), weight 66.7 kg, last menstrual period 09/01/2018, SpO2 99 %.Body mass index is 26.89 kg/m.  General Appearance: Casual  Eye Contact::  Good  Speech:  Normal Rate409  Volume:  Normal  Mood:  Euthymic  Affect:  Congruent  Thought Process:  Goal Directed  Orientation:  Full (Time, Place, and Person)  Thought Content:  Logical  Suicidal Thoughts:  No  Homicidal Thoughts:  No  Memory:  Immediate;   Fair Recent;   Fair Remote;   Fair  Judgement:  Fair  Insight:  Fair  Psychomotor Activity:  Normal  Concentration:  Fair  Recall:  [employer] [place]  Language: Fair  Akathisia:  No  Handed:  Right  AIMS (if indicated):     Assets:  Desire for Improvement Housing Physical Health Social Support  Sleep:  Number of Hours: 6  Cognition: WNL  ADL's:  Intact   Mental Status Per Nursing Assessment::   On Admission:  NA  Demographic Factors:  Unemployed  Loss Factors: Major interpersonal life stresses within her immediate family  Historical Factors: Impulsivity  Risk Reduction Factors:   Responsible for  children under 60 years of age, Sense of responsibility to family, Religious beliefs about death, Living with another person, especially a relative, Positive social support and Positive coping skills or problem solving skills  Continued Clinical Symptoms:  Depression:   Impulsivity  Cognitive Features That Contribute To Risk:  None    Suicide Risk:  Minimal: No identifiable suicidal ideation.  Patients presenting with no risk factors but with morbid ruminations; may be classified as minimal risk based on the severity of the depressive symptoms  Follow-up [facility] Follow up.   Specialty: Behavioral Health Why: You have been added to the waiting list, they will contact you when they can schedule you for services. If you have any questions you can contact [facility] directly. Thank You! Contact information: [place] [address] [phone]           Plan Of Care/Follow-up recommendations:  Activity:  Activity as tolerated Diet:  Regular diet Other:  Follow-up with outpatient providers for therapy if desired.  Alethia Berthold, MD 09/25/2018, 10:04 AM

## 2018-09-25 NOTE — Discharge Summary (Signed)
Physician Discharge Summary Note  Patient:  Gabriela Carter is an 38 y.o., female MRN:  [id number] DOB:  03/17/1980 Patient phone:  [phone] (home)  Patient address:   [address] [place] [id number],  Total Time spent with patient: 1 hour  Date of Admission:  09/23/2018 Date of Discharge: September 25, 2018  Reason for Admission: Admitted through the emergency room where she presented after attempting to swallow some pills during an impulsive phase of an emotional argument at home  Principal Problem: Adjustment disorder with mixed disturbance of emotions and conduct Discharge Diagnoses: Principal Problem:   Adjustment disorder with mixed disturbance of emotions and conduct Active Problems:   Suicidal ideation   Past Psychiatric History: Patient's husband reported and she admitted to having made suicidal threats in the past but has never had any mental health treatment or hospitalization  Past Medical History: History reviewed. No pertinent past medical history.  Past Surgical History:  Procedure Laterality Date  . APPENDECTOMY     Family History: History reviewed. No pertinent family history. Family Psychiatric  History: None reported Social History:  Social History   Substance and Sexual Activity  Alcohol Use No     Social History   Substance and Sexual Activity  Drug Use Not on file    Social History   Socioeconomic History  . Marital status: Married    Spouse name: Not on file  . Number of children: Not on file  . Years of education: Not on file  . Highest education level: Not on file  Occupational History  . Not on file  Social Needs  . Financial resource strain: Not on file  . Food insecurity    Worry: Not on file    Inability: Not on file  . Transportation needs    Medical: Not on file    Non-medical: Not on file  Tobacco Use  . Smoking status: Never Smoker  . Smokeless tobacco: Never Used  Substance and Sexual Activity  . Alcohol  use: No  . Drug use: Not on file  . Sexual activity: Not on file  Lifestyle  . Physical activity    Days per week: Not on file    Minutes per session: Not on file  . Stress: Not on file  Relationships  . Social Musicianconnections    Talks on phone: Not on file    Gets together: Not on file    Attends religious service: Not on file    Active member of club or organization: Not on file    Attends meetings of clubs or organizations: Not on file    Relationship status: Not on file  Other Topics Concern  . Not on file  Social History Narrative  . Not on file    Hospital Course: Patient was kept on 15-minute checks.  She was cooperative and calm throughout her hospital stay.  She did not show any dangerous or suicidal behavior.  Patient denied all suicidal ideation and did not show any aggression.  She appeared to be forthcoming with the treatment team about the events and situation at home that led to her hospitalization.  After review of symptoms and history it did not appear that she had any indication to start psychiatric medicine.  Patient stated that she had no suicidal thoughts and was capable of articulating multiple positive plans for the future.  She gave me permission to notify her husband at the time of discharge which I did.  Patient is being  discharged home and will be given information referring her for outpatient therapy if she desires.  Physical Findings: AIMS:  , ,  ,  ,    CIWA:    COWS:     Musculoskeletal: Strength & Muscle Tone: within normal limits Gait & Station: normal Patient leans: N/A  Psychiatric Specialty Exam: Physical Exam  Nursing note and vitals reviewed. Constitutional: She appears well-developed and well-nourished.  HENT:  Head: Normocephalic and atraumatic.  Eyes: Pupils are equal, round, and reactive to light. Conjunctivae are normal.  Neck: Normal range of motion.  Cardiovascular: Regular rhythm and normal heart sounds.  Respiratory: Effort normal.  No respiratory distress.  GI: Soft.  Musculoskeletal: Normal range of motion.  Neurological: She is alert.  Skin: Skin is warm and dry.  Psychiatric: She has a normal mood and affect. Her speech is normal and behavior is normal. Judgment and thought content normal. Cognition and memory are normal.    Review of Systems  Constitutional: Negative.   HENT: Negative.   Eyes: Negative.   Respiratory: Negative.   Cardiovascular: Negative.   Gastrointestinal: Negative.   Musculoskeletal: Negative.   Skin: Negative.   Neurological: Negative.   Psychiatric/Behavioral: Negative.     Blood pressure 107/71, pulse 86, temperature 97.6 F (36.4 C), temperature source Oral, resp. rate 18, height 5\' 2"  (1.575 m), weight 66.7 kg, last menstrual period 09/01/2018, SpO2 99 %.Body mass index is 26.89 kg/m.  General Appearance: Casual  Eye Contact:  Good  Speech:  Clear and Coherent  Volume:  Normal  Mood:  Euthymic  Affect:  Congruent  Thought Process:  Coherent  Orientation:  Full (Time, Place, and Person)  Thought Content:  Logical  Suicidal Thoughts:  No  Homicidal Thoughts:  No  Memory:  Immediate;   Fair Recent;   Fair Remote;   Fair  Judgement:  Fair  Insight:  Fair  Psychomotor Activity:  Normal  Concentration:  Concentration: Fair  Recall:  Fis[employer]Fair  Fund of Knowledge:  Fair  Language:  Fair  Akathisia:  No  Handed:  Right  AIMS (if indicated):     Assets:  Desire for Improvement Housing Physical Health Resilience Social Support  ADL's:  Intact  Cognition:  WNL  Sleep:  Number of Hours: 6     Have you used any form of tobacco in the last 30 days? (Cigarettes, Smokeless Tobacco, Cigars, and/or Pipes): No  Has this patient used any form of tobacco in the last 30 days? (Cigarettes, Smokeless Tobacco, Cigars, and/or Pipes) Yes, No  Blood Alcohol level:  Lab Results  Component Value Date   ETH <10 09/22/2018    Metabolic Disorder Labs:  No results found for: HGBA1C,  MPG No results found for: PROLACTIN No results found for: CHOL, TRIG, HDL, CHOLHDL, VLDL, LDLCALC  See Psychiatric Specialty Exam and Suicide Risk Assessment completed by Attending Physician prior to discharge.  Discharge destination:  Home  Is patient on multiple antipsychotic therapies at discharge:  No   Has Patient had three or more failed trials of antipsychotic monotherapy by history:  No  Recommended Plan for Multiple Antipsychotic Therapies: NA  Discharge Instructions    Diet - low sodium heart healthy   Complete by: As directed    Increase activity slowly   Complete by: As directed      Allergies as of 09/25/2018      Reactions   Percocet [oxycodone-acetaminophen]       Medication List    STOP taking these  medications   multivitamin with minerals Tabs tablet      Follow-up [facility] Follow up.   Specialty: Behavioral Health Why: You have been added to the waiting list, they will contact you when they can schedule you for services. If you have any questions you can contact [facility] directly. Thank You! Contact information: [place] [address] [phone]           Follow-up recommendations:  Activity:  Activity as tolerated Diet:  Regular diet Other:  Patient has been strongly encouraged to continue getting outpatient psychotherapy.  Resources for people who speak only Spanish are limited in our area.  She has been given information about referral to a Spanish-speaking mental health treatment in [place].  Comments: No prescriptions required at discharge  Signed: Alethia Berthold, MD 09/25/2018, 11:03 AM

## 2018-09-25 NOTE — Progress Notes (Signed)
Patient denies SI/HI, denies A/V hallucinations. Patient verbalizes understanding of discharge instructions, follow up care. Patient given all belongings from [facility] locker. Patient escorted out by staff, transported by family. 

## 2018-11-20 ENCOUNTER — Emergency Department: Payer: Self-pay

## 2018-11-20 ENCOUNTER — Encounter: Payer: Self-pay | Admitting: *Deleted

## 2018-11-20 ENCOUNTER — Emergency Department
Admission: EM | Admit: 2018-11-20 | Discharge: 2018-11-20 | Disposition: A | Discharge status: Home / Self Care | Payer: Self-pay | Attending: Emergency Medicine | Admitting: Emergency Medicine

## 2018-11-20 ENCOUNTER — Other Ambulatory Visit: Payer: Self-pay

## 2018-11-20 DIAGNOSIS — Y929 Unspecified place or not applicable: Secondary | ICD-10-CM | POA: Insufficient documentation

## 2018-11-20 DIAGNOSIS — X509XXA Other and unspecified overexertion or strenuous movements or postures, initial encounter: Secondary | ICD-10-CM | POA: Insufficient documentation

## 2018-11-20 DIAGNOSIS — S93492A Sprain of other ligament of left ankle, initial encounter: Secondary | ICD-10-CM | POA: Insufficient documentation

## 2018-11-20 DIAGNOSIS — Y999 Unspecified external cause status: Secondary | ICD-10-CM | POA: Insufficient documentation

## 2018-11-20 DIAGNOSIS — Y939 Activity, unspecified: Secondary | ICD-10-CM | POA: Insufficient documentation

## 2018-11-20 MED ORDER — MELOXICAM 15 MG PO TABS: 15.0000 mg | ORAL_TABLET | Freq: Every day | ORAL | 1 refills | Status: AC

## 2018-11-20 NOTE — Discharge Instructions (Signed)
Wear Ace wrap for compression. Please take daily meloxicam for pain and inflammation. Follow-up with podiatry as needed.

## 2018-11-20 NOTE — ED Triage Notes (Signed)
Foot is warm, pink, dry, tender to palpation, pulses strong and palpable.

## 2018-11-20 NOTE — ED Triage Notes (Signed)
Pt states She twisted ankle x 3 wks. Pt states she has used OTC remedies since injury, ice and rest, pt states she is unable to bear weight due to the pain. Pt has taken tylenol and ibuprofen w/o relief. Last took ibuprofen this morning.

## 2018-11-20 NOTE — ED Provider Notes (Signed)
[facility] Emergency Department Provider Note  ____________________________________________  Time seen: Approximately 4:33 PM  I have reviewed the triage vital signs and the nursing notes.   HISTORY  Chief Complaint Ankle Injury    HPI Gabriela Carter is a 38 y.o. female presents to the emergency department with acute left ankle pain for the past 3 weeks.  Patient sustained an inversion type ankle injury and has been having pain over the anterior talofibular ligament and the deltoid ligament.  She reports that swelling is improved but pain is persisted and she became concerned.  No numbness or tingling of the left ankle.  No pain in the left calf.  She denies similar injuries in the past.  She has been taking ibuprofen for pain.        History reviewed. No pertinent past medical history.  Patient Active Problem List   Diagnosis Date Noted  . Suicidal ideation 09/24/2018  . 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 MDD (major depressive disorder), recurrent severe, without psychosis (HCC) 09/23/2018    Past Surgical History:  Procedure Laterality Date  . APPENDECTOMY      Prior to Admission medications   Medication Sig Start Date End Date Taking? Authorizing Provider  meloxicam (MOBIC) 15 MG tablet Take 1 tablet (15 mg total) by mouth daily for 7 days. 11/20/18 11/27/18  Orvil Feil, PA-C    Allergies Percocet [oxycodone-acetaminophen]  History reviewed. No pertinent family history.  Social History Social History   Tobacco Use  . Smoking status: Never Smoker  . Smokeless tobacco: Never Used  Substance Use Topics  . Alcohol use: No  . Drug use: Never     Review of Systems  Constitutional: No fever/chills Eyes: No visual changes. No discharge ENT: No upper respiratory complaints. Cardiovascular: no chest pain. Respiratory: no cough. No SOB. Gastrointestinal: No abdominal pain.  No nausea, no  vomiting.  No diarrhea.  No constipation. Musculoskeletal: Patient has left ankle pain.  Skin: Negative for rash, abrasions, lacerations, ecchymosis. Neurological: Negative for headaches, focal weakness or numbness.   ____________________________________________   PHYSICAL EXAM:  VITAL SIGNS: ED Triage Vitals  Enc Vitals Group     BP 11/20/18 1500 102/70     Pulse Rate 11/20/18 1500 82     Resp 11/20/18 1500 18     Temp 11/20/18 1500 (!) 97.5 F (36.4 C)     Temp Source 11/20/18 1500 Oral     SpO2 11/20/18 1500 97 %     Weight 11/20/18 1506 145 lb (65.8 kg)     Height 11/20/18 1506 5\' 2"  (1.575 m)     Head Circumference --      Peak Flow --      Pain Score 11/20/18 1504 6     Pain Loc --      Pain Edu? --      Excl. in GC? --      Constitutional: Alert and oriented. Well appearing and in no acute distress. Eyes: Conjunctivae are normal. PERRL. EOMI. Head: Atraumatic. Cardiovascular: Normal rate, regular rhythm. Normal S1 and S2.  Good peripheral circulation. Respiratory: Normal respiratory effort without tachypnea or retractions. Lungs CTAB. Good air entry to the bases with no decreased or absent breath sounds. Gastrointestinal: Bowel sounds 4 quadrants. Soft and nontender to palpation. No guarding or rigidity. No palpable masses. No distention. No CVA tenderness. Musculoskeletal: Patient is able to perform full range of motion at the left ankle.  She  has tenderness to palpation over the anterior talofibular ligament and the deltoid ligament.  Palpable dorsalis pedis pulse, left.  Capillary refill less than 2 seconds for all 5 left toes. Neurologic:  Normal speech and language. No gross focal neurologic deficits are appreciated.  Skin:  Skin is warm, dry and intact. No rash noted. Psychiatric: Mood and affect are normal. Speech and behavior are normal. Patient exhibits appropriate insight and judgement.   ____________________________________________   LABS (all labs  ordered are listed, but only abnormal results are displayed)  Labs Reviewed - No data to display ____________________________________________  EKG   ____________________________________________  RADIOLOGY I personally viewed and evaluated these images as part of my medical decision making, as well as reviewing the written report by the radiologist.  Dg Ankle Complete Left  Result Date: 11/20/2018 CLINICAL DATA:  Acute LEFT ankle pain following twisting injury 3 weeks ago. Initial encounter. EXAM: LEFT ANKLE COMPLETE - 3+ VIEW COMPARISON:  None. FINDINGS: There is no evidence of fracture, dislocation, or joint effusion. There is no evidence of arthropathy or other focal bone abnormality. Soft tissues are unremarkable. IMPRESSION: Negative. Electronically Signed   By: Margarette Canada M.D.   On: 11/20/2018 15:57    ____________________________________________    PROCEDURES  Procedure(s) performed:    Procedures    Medications - No data to display   ____________________________________________   INITIAL IMPRESSION / ASSESSMENT AND PLAN / ED COURSE  Pertinent labs & imaging results that were available during my care of the patient were reviewed by me and considered in my medical decision making (see chart for details).  Review of the [place] CSRS was performed in accordance of the [place] prior to dispensing any controlled drugs.         Assessment and plan Left ankle pain 38 year old female presents to the emergency department with acute left ankle pain after an inversion type ankle injury.  X-ray examination of the left ankle reveals no bony abnormality.  Patient had tenderness to palpation over the anterior talofibular ligament and the deltoid ligament.  An Ace wrap was applied in the emergency department for compression and patient was discharged with meloxicam.  Supportive measures were encouraged.  However, patient was advised that if pain persist, she should follow-up with  podiatry.  All patient questions were answered.   ____________________________________________  FINAL CLINICAL IMPRESSION(S) / ED DIAGNOSES  Final diagnoses:  Sprain of anterior talofibular ligament of left ankle, initial encounter      NEW MEDICATIONS STARTED DURING THIS VISIT:  ED Discharge Orders         Ordered    meloxicam (MOBIC) 15 MG tablet  Daily     11/20/18 1626              This chart was dictated using voice recognition software/Dragon. Despite best efforts to proofread, errors can occur which can change the meaning. Any change was purely unintentional.    Lannie Fields, PA-C 11/20/18 1636    Nena Polio, MD 11/20/18 [phone]

## 2018-11-20 NOTE — ED Notes (Signed)
See triage note  Presents with pain to left ankle  States she fell and twisted her ankle about 3 weeks ago  No swelling noted   Good pulses   Having increased pain

## 2019-05-10 ENCOUNTER — Ambulatory Visit: Payer: Self-pay | Attending: Internal Medicine

## 2019-05-10 DIAGNOSIS — Z23 Encounter for immunization: Secondary | ICD-10-CM

## 2019-05-10 NOTE — Progress Notes (Signed)
   Covid-19 Vaccination Clinic  Name:  Gabriela Carter    MRN: [id number] DOB: [birth year]  05/10/2019  [name] was observed post Covid-19 immunization for 15 minutes without incident. She was provided with Vaccine Information Sheet and instruction to access the V-Safe system.   [name] was instructed to call 911 with any severe reactions post vaccine: Marland Kitchen Difficulty breathing  . Swelling of face and throat  . A fast heartbeat  . A bad rash all over body  . Dizziness and weakness   Immunizations Administered    Name Date Dose VIS Date Route   Pfizer COVID-19 Vaccine 05/10/2019  7:10 PM 0.3 mL 01/23/2019 Intramuscular   Manufacturer: [employer], [employer]   Lot: [id number]   NDC: [address]-1

## 2019-05-31 ENCOUNTER — Ambulatory Visit: Payer: Self-pay | Attending: Internal Medicine

## 2019-05-31 DIAGNOSIS — Z23 Encounter for immunization: Secondary | ICD-10-CM

## 2019-05-31 NOTE — Progress Notes (Signed)
   Covid-19 Vaccination Clinic  Name:  Samreen Seltzer    MRN: [id number] DOB: 10-26-1980  05/31/2019  Ms. Pendergrass was observed post Covid-19 immunization for 15 minutes without incident. She was provided with Vaccine Information Sheet and instruction to access the V-Safe system.   Ms. Dietzman was instructed to call 911 with any severe reactions post vaccine: Marland Kitchen Difficulty breathing  . Swelling of face and throat  . A fast heartbeat  . A bad rash all over body  . Dizziness and weakness   Immunizations Administered    Name Date Dose VIS Date Route   Pfizer COVID-19 Vaccine 05/31/2019  6:37 PM 0.3 mL 01/23/2019 Intramuscular   Manufacturer: [employer], [employer]   Lot: [id number]   NDC: [address]-6

## 2019-11-05 ENCOUNTER — Emergency Department: Payer: Self-pay

## 2019-11-05 ENCOUNTER — Other Ambulatory Visit: Payer: Self-pay

## 2019-11-05 ENCOUNTER — Emergency Department
Admission: EM | Admit: 2019-11-05 | Discharge: 2019-11-05 | Disposition: A | Discharge status: Home / Self Care | Payer: Self-pay | Attending: Emergency Medicine | Admitting: Emergency Medicine

## 2019-11-05 ENCOUNTER — Encounter: Payer: Self-pay | Admitting: Emergency Medicine

## 2019-11-05 DIAGNOSIS — W5501XA Bitten by cat, initial encounter: Secondary | ICD-10-CM | POA: Insufficient documentation

## 2019-11-05 DIAGNOSIS — S61452A Open bite of left hand, initial encounter: Secondary | ICD-10-CM | POA: Insufficient documentation

## 2019-11-05 DIAGNOSIS — Y9389 Activity, other specified: Secondary | ICD-10-CM | POA: Insufficient documentation

## 2019-11-05 DIAGNOSIS — Y9289 Other specified places as the place of occurrence of the external cause: Secondary | ICD-10-CM | POA: Insufficient documentation

## 2019-11-05 DIAGNOSIS — Y998 Other external cause status: Secondary | ICD-10-CM | POA: Insufficient documentation

## 2019-11-05 MED ORDER — TRAMADOL HCL 50 MG PO TABS: 50.0000 mg | ORAL_TABLET | Freq: Four times a day (QID) | ORAL | 0 refills | Status: AC | PRN

## 2019-11-05 MED ORDER — AMOXICILLIN-POT CLAVULANATE 875-125 MG PO TABS: 1.0000 | ORAL_TABLET | Freq: Two times a day (BID) | ORAL | 0 refills | Status: AC

## 2019-11-05 NOTE — ED Triage Notes (Signed)
Has area on left hand red and sore.  Abrasion present.  Via interpretter she says her cat bit her on Monday.  She says the rabies shots are up to date.

## 2019-11-05 NOTE — ED Provider Notes (Signed)
[facility] Emergency Department Provider Note  ____________________________________________  Time seen: Approximately 8:05 AM  I have reviewed the triage vital signs and the nursing notes.   HISTORY  Chief Complaint Hand Pain    HPI Gabriela Carter is a 39 y.o. female that presents to the emergency department for evaluation of cat bite to left hand for 3 days.  Patient's cat bit her on Monday.  Patient has been able to express some purulent drainage from the puncture site.  She has some mild swelling around the puncture.  Cats vaccines including rabies are up-to-date.  Patient's last tetanus was 1 year ago.   History reviewed. No pertinent past medical history.  Patient Active Problem List   Diagnosis Date Noted  . Suicidal ideation 09/24/2018  . 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 MDD (major depressive disorder), recurrent severe, without psychosis (HCC) 09/23/2018    Past Surgical History:  Procedure Laterality Date  . APPENDECTOMY      Prior to Admission medications   Medication Sig Start Date End Date Taking? Authorizing Provider  amoxicillin-clavulanate (AUGMENTIN) 875-125 MG tablet Take 1 tablet by mouth 2 (two) times daily for 10 days. 11/05/19 11/15/19  Enid Derry, PA-C  traMADol (ULTRAM) 50 MG tablet Take 1 tablet (50 mg total) by mouth every 6 (six) hours as needed. 11/05/19 11/04/20  Enid Derry, PA-C    Allergies Percocet [oxycodone-acetaminophen]  No family history on file.  Social History Social History   Tobacco Use  . Smoking status: Never Smoker  . Smokeless tobacco: Never Used  Vaping Use  . Vaping Use: Never used  Substance Use Topics  . Alcohol use: No  . Drug use: Never     Review of Systems  Constitutional: No fever/chills Gastrointestinal:   No nausea, no vomiting.  Musculoskeletal: Negative for musculoskeletal pain. Skin: Negative for rash, abrasions, lacerations,  ecchymosis.  Positive for puncture.   ____________________________________________   PHYSICAL EXAM:  VITAL SIGNS: ED Triage Vitals  Enc Vitals Group     BP 11/05/19 0736 103/66     Pulse Rate 11/05/19 0736 67     Resp 11/05/19 0736 14     Temp 11/05/19 0736 98.2 F (36.8 C)     Temp Source 11/05/19 0736 Oral     SpO2 11/05/19 0736 98 %     Weight 11/05/19 0740 146 lb (66.2 kg)     Height 11/05/19 0740 5\' 2"  (1.575 m)     Head Circumference --      Peak Flow --      Pain Score 11/05/19 0737 6     Pain Loc --      Pain Edu? --      Excl. in GC? --      Constitutional: Alert and oriented. Well appearing and in no acute distress. Eyes: Conjunctivae are normal. PERRL. EOMI. Head: Atraumatic. ENT:      Ears:      Nose: No congestion/rhinnorhea.      Mouth/Throat: Mucous membranes are moist.  Neck: No stridor. Cardiovascular: Normal rate, regular rhythm.  Good peripheral circulation. Respiratory: Normal respiratory effort without tachypnea or retractions. Lungs CTAB. Good air entry to the bases with no decreased or absent breath sounds. Musculoskeletal: Full range of motion to all extremities. No gross deformities appreciated. Neurologic:  Normal speech and language. No gross focal neurologic deficits are appreciated.  Skin:  Skin is warm, dry.  Puncture to left hand between thumb and index  finger.  Mild swelling and erythema surrounding the puncture site. Psychiatric: Mood and affect are normal. Speech and behavior are normal. Patient exhibits appropriate insight and judgement.   ____________________________________________   LABS (all labs ordered are listed, but only abnormal results are displayed)  Labs Reviewed - No data to display ____________________________________________  EKG   ____________________________________________  RADIOLOGY Lexine Baton, personally viewed and evaluated these images (plain radiographs) as part of my medical decision making, as  well as reviewing the written report by the radiologist.  DG Hand Complete Left  Result Date: 11/05/2019 CLINICAL DATA:  Cat bite. EXAM: LEFT HAND - COMPLETE 3+ VIEW COMPARISON:  None. FINDINGS: There is no evidence of fracture or dislocation. There is no evidence of arthropathy or other focal bone abnormality. Soft tissues are unremarkable. IMPRESSION: Negative. Electronically Signed   By: Lupita Raider M.D.   On: 11/05/2019 08:15    ____________________________________________    PROCEDURES  Procedure(s) performed:    Procedures    Medications - No data to display   ____________________________________________   INITIAL IMPRESSION / ASSESSMENT AND PLAN / ED COURSE  Pertinent labs & imaging results that were available during my care of the patient were reviewed by me and considered in my medical decision making (see chart for details).  Review of the [place] CSRS was performed in accordance of the [facility] prior to dispensing any controlled drugs.   Patient presented the emergency department for evaluation of cat bite.  Vital signs and exam are reassuring.  X-ray negative for acute bony abnormality or foreign body.  Cat is up-to-date on rabies vaccines.  Patient's tetanus is up-to-date.  Patient will be discharged home with prescriptions for Augmentin. Patient is to follow up with primary care as directed. Patient is given ED precautions to return to the ED for any worsening or new symptoms.  Gabriela Carter was evaluated in Emergency Department on 11/05/2019 for the symptoms described in the history of present illness. She was evaluated in the context of the global COVID-19 pandemic, which necessitated consideration that the patient might be at risk for infection with the SARS-CoV-2 virus that causes COVID-19. Institutional protocols and algorithms that pertain to the evaluation of patients at risk for COVID-19 are in a state of rapid change based on information released by regulatory  bodies including the CDC and federal and state organizations. These policies and algorithms were followed during the patient's care in the ED.   ____________________________________________  FINAL CLINICAL IMPRESSION(S) / ED DIAGNOSES  Final diagnoses:  Cat bite, initial encounter      NEW MEDICATIONS STARTED DURING THIS VISIT:  ED Discharge Orders         Ordered    amoxicillin-clavulanate (AUGMENTIN) 875-125 MG tablet  2 times daily        11/05/19 0824    traMADol (ULTRAM) 50 MG tablet  Every 6 hours PRN        11/05/19 0824              This chart was dictated using voice recognition software/Dragon. Despite best efforts to proofread, errors can occur which can change the meaning. Any change was purely unintentional.    Enid Derry, PA-C 11/05/19 1336    Shaune Pollack, MD 11/06/19 1413

## 2020-01-24 ENCOUNTER — Other Ambulatory Visit: Payer: Self-pay

## 2020-01-24 ENCOUNTER — Encounter: Payer: Self-pay | Admitting: Emergency Medicine

## 2020-01-24 ENCOUNTER — Emergency Department
Admission: EM | Admit: 2020-01-24 | Discharge: 2020-01-24 | Disposition: A | Discharge status: Home / Self Care | Payer: Self-pay | Attending: Emergency Medicine | Admitting: Emergency Medicine

## 2020-01-24 ENCOUNTER — Emergency Department: Payer: Self-pay

## 2020-01-24 DIAGNOSIS — K2900 Acute gastritis without bleeding: Secondary | ICD-10-CM | POA: Insufficient documentation

## 2020-01-24 DIAGNOSIS — R1013 Epigastric pain: Secondary | ICD-10-CM

## 2020-01-24 LAB — CBC WITH DIFFERENTIAL/PLATELET
Abs Immature Granulocytes: 0.03 10*3/uL (ref 0.00–0.07)
Basophils Absolute: 0 10*3/uL (ref 0.0–0.1)
Basophils Relative: 0 %
Eosinophils Absolute: 0.2 10*3/uL (ref 0.0–0.5)
Eosinophils Relative: 2 %
HCT: 40.4 % (ref 36.0–46.0)
Hemoglobin: 13.8 g/dL (ref 12.0–15.0)
Immature Granulocytes: 0 %
Lymphocytes Relative: 32 %
Lymphs Abs: 3.4 10*3/uL (ref 0.7–4.0)
MCH: 29.2 pg (ref 26.0–34.0)
MCHC: 34.2 g/dL (ref 30.0–36.0)
MCV: 85.4 fL (ref 80.0–100.0)
Monocytes Absolute: 0.7 10*3/uL (ref 0.1–1.0)
Monocytes Relative: 7 %
Neutro Abs: 6.3 10*3/uL (ref 1.7–7.7)
Neutrophils Relative %: 59 %
Platelets: 272 10*3/uL (ref 150–400)
RBC: 4.73 MIL/uL (ref 3.87–5.11)
RDW: 13.8 % (ref 11.5–15.5)
WBC: 10.6 10*3/uL — ABNORMAL HIGH (ref 4.0–10.5)
nRBC: 0 % (ref 0.0–0.2)

## 2020-01-24 LAB — LACTIC ACID, PLASMA: Lactic Acid, Venous: 1.3 mmol/L (ref 0.5–1.9)

## 2020-01-24 LAB — TROPONIN I (HIGH SENSITIVITY): Troponin I (High Sensitivity): <2 ng/L (ref <18)

## 2020-01-24 LAB — COMPREHENSIVE METABOLIC PANEL
ALT: 19 U/L (ref 0–44)
AST: 21 U/L (ref 15–41)
Albumin: 4.1 g/dL (ref 3.5–5.0)
Alkaline Phosphatase: 85 U/L (ref 38–126)
Anion gap: 8 (ref 5–15)
BUN: 13 mg/dL (ref 6–20)
CO2: 26 mmol/L (ref 22–32)
Calcium: 8.9 mg/dL (ref 8.9–10.3)
Chloride: 107 mmol/L (ref 98–111)
Creatinine, Ser: 0.8 mg/dL (ref 0.44–1.00)
GFR, Estimated: >60 mL/min (ref >60)
Glucose, Bld: 102 mg/dL — ABNORMAL HIGH (ref 70–99)
Potassium: 3.9 mmol/L (ref 3.5–5.1)
Sodium: 141 mmol/L (ref 135–145)
Total Bilirubin: 0.6 mg/dL (ref 0.3–1.2)
Total Protein: 7.8 g/dL (ref 6.5–8.1)

## 2020-01-24 LAB — HCG, QUANTITATIVE, PREGNANCY: hCG, Beta Chain, Quant, S: <1 m[IU]/mL (ref <5)

## 2020-01-24 LAB — LIPASE, BLOOD: Lipase: 42 U/L (ref 11–51)

## 2020-01-24 MED ORDER — KETOROLAC TROMETHAMINE 30 MG/ML IJ SOLN
30.0000 mg | Freq: Once | INTRAMUSCULAR | Status: AC
  Administered 2020-01-24: 30 mg via INTRAVENOUS
  Filled 2020-01-24: qty 1

## 2020-01-24 MED ORDER — PANTOPRAZOLE SODIUM 40 MG PO TBEC: 40.0000 mg | DELAYED_RELEASE_TABLET | Freq: Every day | ORAL | 1 refills | Status: AC

## 2020-01-24 MED ORDER — SUCRALFATE 1 G PO TABS: 1.0000 g | ORAL_TABLET | Freq: Four times a day (QID) | ORAL | 1 refills | Status: AC

## 2020-01-24 MED ORDER — ONDANSETRON HCL 4 MG/2ML IJ SOLN
4.0000 mg | Freq: Once | INTRAMUSCULAR | Status: AC
  Administered 2020-01-24: 4 mg via INTRAVENOUS
  Filled 2020-01-24: qty 2

## 2020-01-24 MED ORDER — IOHEXOL 300 MG/ML  SOLN
100.0000 mL | Freq: Once | INTRAMUSCULAR | Status: AC | PRN
  Administered 2020-01-24: 05:00:00 100 mL via INTRAVENOUS

## 2020-01-24 NOTE — ED Provider Notes (Signed)
[facility] Emergency Department Provider Note   ____________________________________________   Event Date/Time   First MD Initiated Contact with Patient 01/24/20 0244     (approximate)  I have reviewed the triage vital signs and the nursing notes.   HISTORY  Chief Complaint Abdominal Pain    HPI Gabriela Carter is a 39 y.o. female with stated past medical history of appendectomy who presents for upper abdominal pain for the last 2 days.  Patient describes aching upper abdominal pain that is worse in the left upper quadrant and radiates through to her back.  Patient describes 9/10 pain that is worsened when laying flat or after eating and is associated with nausea without vomiting.  Patient denies any symptoms similar to this in the past.  Patient denies any history of diverticulosis.         History reviewed. No pertinent past medical history.  Patient Active Problem List   Diagnosis Date Noted  . Suicidal ideation 09/24/2018  . 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 MDD (major depressive disorder), recurrent severe, without psychosis (HCC) 09/23/2018    Past Surgical History:  Procedure Laterality Date  . APPENDECTOMY      Prior to Admission medications   Medication Sig Start Date End Date Taking? Authorizing Provider  pantoprazole (PROTONIX) 40 MG tablet Take 1 tablet (40 mg total) by mouth daily. 01/24/20 01/23/21  Merwyn Katos, MD  sucralfate (CARAFATE) 1 g tablet Take 1 tablet (1 g total) by mouth 4 (four) times daily. 01/24/20 01/23/21  Merwyn Katos, MD  traMADol (ULTRAM) 50 MG tablet Take 1 tablet (50 mg total) by mouth every 6 (six) hours as needed. 11/05/19 11/04/20  Enid Derry, PA-C    Allergies Percocet [oxycodone-acetaminophen]  History reviewed. No pertinent family history.  Social History Social History   Tobacco Use  . Smoking status: Never Smoker  . Smokeless tobacco:  Never Used  Vaping Use  . Vaping Use: Never used  Substance Use Topics  . Alcohol use: No  . Drug use: Never    Review of Systems Constitutional: No fever/chills Eyes: No visual changes. ENT: No sore throat. Cardiovascular: Denies chest pain. Respiratory: Denies shortness of breath. Gastrointestinal: Endorses abdominal pain.  Endorses nausea, no vomiting.  No diarrhea. Genitourinary: Negative for dysuria. Musculoskeletal: Negative for acute arthralgias Skin: Negative for rash. Neurological: Negative for headaches, weakness/numbness/paresthesias in any extremity Psychiatric: Negative for suicidal ideation/homicidal ideation   ____________________________________________   PHYSICAL EXAM:  VITAL SIGNS: ED Triage Vitals  Enc Vitals Group     BP 01/24/20 0256 128/83     Pulse Rate 01/24/20 0256 84     Resp 01/24/20 0256 18     Temp 01/24/20 0256 98.4 F (36.9 C)     Temp Source 01/24/20 0256 Oral     SpO2 01/24/20 0256 100 %     Weight 01/24/20 0253 147 lb (66.7 kg)     Height 01/24/20 0253 5\' 2"  (1.575 m)     Head Circumference --      Peak Flow --      Pain Score 01/24/20 0253 8     Pain Loc --      Pain Edu? --      Excl. in GC? --    Constitutional: Alert and oriented. Well appearing and in no acute distress. Eyes: Conjunctivae are normal. PERRL. Head: Atraumatic. Nose: No congestion/rhinnorhea. Mouth/Throat: Mucous membranes are moist. Neck: No stridor Cardiovascular: Grossly normal  heart sounds.  Good peripheral circulation. Respiratory: Normal respiratory effort.  No retractions. Gastrointestinal: Soft.  Generalized tenderness to palpation that is worse in the left upper quadrant. No distention. Musculoskeletal: No obvious deformities Neurologic:  Normal speech and language. No gross focal neurologic deficits are appreciated. Skin:  Skin is warm and dry. No rash noted. Psychiatric: Mood and affect are normal. Speech and behavior are  normal.  ____________________________________________   LABS (all labs ordered are listed, but only abnormal results are displayed)  Labs Reviewed  COMPREHENSIVE METABOLIC PANEL - Abnormal; Notable for the following components:      Result Value   Glucose, Bld 102 (*)    All other components within normal limits  CBC WITH DIFFERENTIAL/PLATELET - Abnormal; Notable for the following components:   WBC 10.6 (*)    All other components within normal limits  LIPASE, BLOOD  LACTIC ACID, PLASMA  HCG, QUANTITATIVE, PREGNANCY  URINALYSIS, COMPLETE (UACMP) WITH MICROSCOPIC  TROPONIN I (HIGH SENSITIVITY)   ____________________________________________  EKG  ED ECG REPORT I, Merwyn Katos, the attending physician, personally viewed and interpreted this ECG.  Date: 01/24/2020 EKG Time: 0316 Rate: 80 Rhythm: normal sinus rhythm QRS Axis: normal Intervals: normal ST/T Wave abnormalities: normal Narrative Interpretation: no evidence of acute ischemia  ____________________________________________  RADIOLOGY  ED MD interpretation: CT of the abdomen pelvis with IV contrast shows no evidence of acute abnormalities  Official radiology report(s): CT Abdomen Pelvis W Contrast  Result Date: 01/24/2020 CLINICAL DATA:  Abdominal pain radiating to back. History of appendectomy. Nausea. EXAM: CT ABDOMEN AND PELVIS WITH CONTRAST TECHNIQUE: Multidetector CT imaging of the abdomen and pelvis was performed using the standard protocol following bolus administration of intravenous contrast. CONTRAST:  OMNIPAQUE IOHEXOL 300 MG/ML  SOLN COMPARISON:  CT 08/21/2007 FINDINGS: Lower chest: Lung bases are clear. Hepatobiliary: No focal hepatic lesion. No biliary duct dilatation. Common bile duct is normal. Pancreas: Pancreas is normal. No ductal dilatation. No pancreatic inflammation. Spleen: Normal spleen Adrenals/urinary tract: Adrenal glands and kidneys are normal. Post appendectomy the ureters and  bladder normal. Stomach/Bowel: Stomach, small bowel, and cecum are normal. The colon and rectosigmoid colon are normal. Vascular/Lymphatic: Abdominal aorta is normal caliber. No periportal or retroperitoneal adenopathy. No pelvic adenopathy. Reproductive: Uterus and adnexa unremarkable. Other: No free fluid. Musculoskeletal: No aggressive osseous lesion. IMPRESSION: 1. No acute findings in the abdomen pelvis. 2. Post appendectomy. Electronically Signed   By: Genevive Bi M.D.   On: 01/24/2020 05:47    ____________________________________________   PROCEDURES  Procedure(s) performed (including Critical Care):  .1-3 Lead EKG Interpretation Performed by: Merwyn Katos, MD Authorized by: Merwyn Katos, MD     Interpretation: normal     ECG rate:  69   ECG rate assessment: normal     Rhythm: sinus rhythm     Ectopy: none     Conduction: normal       ____________________________________________   INITIAL IMPRESSION / ASSESSMENT AND PLAN / ED COURSE  As part of my medical decision making, I reviewed the following data within the electronic MEDICAL RECORD NUMBER Nursing notes reviewed and incorporated, Labs reviewed, EKG interpreted, Old chart reviewed, Radiograph reviewed and Notes from prior ED visits reviewed and incorporated        Patients symptoms not typical for emergent causes of abdominal pain such as, but not limited to, appendicitis, abdominal aortic aneurysm, surgical biliary disease, pancreatitis, SBO, mesenteric ischemia, serious intra-abdominal bacterial illness. Presentation also not typical of gynecologic emergencies such  as TOA, Ovarian Torsion, PID. Not Ectopic. Doubt atypical ACS.  Pt tolerating PO. Disposition: Patient will be discharged with strict return precautions and follow up with primary MD within 12-24 hours for further evaluation. Patient understands that this still may have an early presentation of an emergent medical condition such as appendicitis  that will require a recheck.      ____________________________________________   FINAL CLINICAL IMPRESSION(S) / ED DIAGNOSES  Final diagnoses:  Acute gastritis without hemorrhage, unspecified gastritis type  Epigastric pain     ED Discharge Orders         Ordered    pantoprazole (PROTONIX) 40 MG tablet  Daily        01/24/20 0600    sucralfate (CARAFATE) 1 g tablet  4 times daily        01/24/20 0600           Note:  This document was prepared using Dragon voice recognition software and may include unintentional dictation errors.   Merwyn Katos, MD 01/24/20 [phone]

## 2020-01-24 NOTE — ED Notes (Signed)
Initial assessment completed with [employer] interpreter Adal # [id number]

## 2020-01-24 NOTE — ED Triage Notes (Signed)
Pt arrived via POV with reports of abd since Friday and radiating to back.  Triage assessment per [facility]. Adal # [id number]  C/o nausea.

## 2020-10-20 ENCOUNTER — Emergency Department: Payer: Self-pay

## 2020-10-20 ENCOUNTER — Emergency Department
Admission: EM | Admit: 2020-10-20 | Discharge: 2020-10-20 | Disposition: A | Discharge status: Home / Self Care | Payer: Self-pay | Attending: Emergency Medicine | Admitting: Emergency Medicine

## 2020-10-20 ENCOUNTER — Other Ambulatory Visit: Payer: Self-pay

## 2020-10-20 DIAGNOSIS — R197 Diarrhea, unspecified: Secondary | ICD-10-CM | POA: Insufficient documentation

## 2020-10-20 DIAGNOSIS — R1110 Vomiting, unspecified: Secondary | ICD-10-CM | POA: Insufficient documentation

## 2020-10-20 DIAGNOSIS — R1013 Epigastric pain: Secondary | ICD-10-CM | POA: Insufficient documentation

## 2020-10-20 DIAGNOSIS — R109 Unspecified abdominal pain: Secondary | ICD-10-CM

## 2020-10-20 LAB — HEPATIC FUNCTION PANEL
ALT: 35 U/L (ref 0–44)
AST: 49 U/L — ABNORMAL HIGH (ref 15–41)
Albumin: 3.6 g/dL (ref 3.5–5.0)
Alkaline Phosphatase: 78 U/L (ref 38–126)
Bilirubin, Direct: 0.1 mg/dL (ref 0.0–0.2)
Indirect Bilirubin: 0.4 mg/dL (ref 0.3–0.9)
Total Bilirubin: 0.5 mg/dL (ref 0.3–1.2)
Total Protein: 6.5 g/dL (ref 6.5–8.1)

## 2020-10-20 LAB — URINALYSIS, COMPLETE (UACMP) WITH MICROSCOPIC
Bilirubin Urine: NEGATIVE
Glucose, UA: NEGATIVE mg/dL
Ketones, ur: NEGATIVE mg/dL
Leukocytes,Ua: NEGATIVE
Nitrite: NEGATIVE
Protein, ur: NEGATIVE mg/dL
Specific Gravity, Urine: 1.025 (ref 1.005–1.030)
pH: 5.5 (ref 5.0–8.0)

## 2020-10-20 LAB — BASIC METABOLIC PANEL
Anion gap: 4 — ABNORMAL LOW (ref 5–15)
BUN: 11 mg/dL (ref 6–20)
CO2: 26 mmol/L (ref 22–32)
Calcium: 8.6 mg/dL — ABNORMAL LOW (ref 8.9–10.3)
Chloride: 109 mmol/L (ref 98–111)
Creatinine, Ser: 0.81 mg/dL (ref 0.44–1.00)
GFR, Estimated: >60 mL/min (ref >60)
Glucose, Bld: 112 mg/dL — ABNORMAL HIGH (ref 70–99)
Potassium: 3.3 mmol/L — ABNORMAL LOW (ref 3.5–5.1)
Sodium: 139 mmol/L (ref 135–145)

## 2020-10-20 LAB — LIPASE, BLOOD: Lipase: 35 U/L (ref 11–51)

## 2020-10-20 LAB — CBC
HCT: 37.3 % (ref 36.0–46.0)
Hemoglobin: 12.5 g/dL (ref 12.0–15.0)
MCH: 28.5 pg (ref 26.0–34.0)
MCHC: 33.5 g/dL (ref 30.0–36.0)
MCV: 85 fL (ref 80.0–100.0)
Platelets: 249 10*3/uL (ref 150–400)
RBC: 4.39 MIL/uL (ref 3.87–5.11)
RDW: 14.6 % (ref 11.5–15.5)
WBC: 12.3 10*3/uL — ABNORMAL HIGH (ref 4.0–10.5)
nRBC: 0 % (ref 0.0–0.2)

## 2020-10-20 LAB — POC URINE PREG, ED: Preg Test, Ur: NEGATIVE

## 2020-10-20 LAB — TROPONIN I (HIGH SENSITIVITY): Troponin I (High Sensitivity): 5 ng/L (ref <18)

## 2020-10-20 MED ORDER — ALUM & MAG HYDROXIDE-SIMETH 200-200-20 MG/5ML PO SUSP
30.0000 mL | Freq: Once | ORAL | Status: AC
  Administered 2020-10-20: 30 mL via ORAL
  Filled 2020-10-20: qty 30

## 2020-10-20 MED ORDER — MORPHINE SULFATE (PF) 4 MG/ML IV SOLN
4.0000 mg | Freq: Once | INTRAVENOUS | Status: AC
  Administered 2020-10-20: 4 mg via INTRAVENOUS
  Filled 2020-10-20: qty 1

## 2020-10-20 MED ORDER — SODIUM CHLORIDE 0.9 % IV BOLUS
1000.0000 mL | Freq: Once | INTRAVENOUS | Status: AC
Start: 2020-10-20 — End: 2020-10-20
  Administered 2020-10-20: 1000 mL via INTRAVENOUS

## 2020-10-20 MED ORDER — FAMOTIDINE 40 MG PO TABS: 40.0000 mg | ORAL_TABLET | Freq: Every evening | ORAL | 1 refills | Status: AC

## 2020-10-20 MED ORDER — ONDANSETRON HCL 4 MG/2ML IJ SOLN
4.0000 mg | Freq: Once | INTRAMUSCULAR | Status: AC
  Administered 2020-10-20: 4 mg via INTRAVENOUS
  Filled 2020-10-20: qty 2

## 2020-10-20 MED ORDER — OMEPRAZOLE MAGNESIUM 20 MG PO TBEC: 40.0000 mg | DELAYED_RELEASE_TABLET | Freq: Every day | ORAL | 0 refills | Status: AC

## 2020-10-20 NOTE — ED Triage Notes (Signed)
Pt to ED for epigastric pain that started a few hours ago, now having pain left arm and shoulder. Emesis x4 today.  Denies gallbladder or cardiac hx

## 2020-10-20 NOTE — ED Provider Notes (Signed)
[facility]-EMERGENCY DEPARTMENT  ____________________________________________  Time seen: Approximately 6:05 PM  I have reviewed the triage vital signs and the nursing notes.   HISTORY  Chief Complaint Abdominal Pain and Emesis   Historian Patient     HPI Gabriela Carter is a 40 y.o. female with a history of gastritis presents to the emergency department with epigastric abdominal pain that started this morning as well as diarrhea and 4 episodes of vomiting.  Patient reports that pain is 10 out of 10 in intensity and constant in nature and radiates to her left shoulder.  She denies experiencing similar symptoms in the past and states that her current discomfort is different than her usual gastritis pain.  She denies dysuria, hematuria or increased urinary frequency. No other alleviating measures have been attempted.    History reviewed. No pertinent past medical history.   Immunizations up to date:  Yes.     History reviewed. No pertinent past medical history.  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 Authorizing Provider  pantoprazole (PROTONIX) 40 MG tablet Take 1 tablet (40 mg total) by mouth daily. 01/24/20 01/23/21  Merwyn Katos, MD  sucralfate (CARAFATE) 1 g tablet Take 1 tablet (1 g total) by mouth 4 (four) times daily. 01/24/20 01/23/21  Merwyn Katos, MD  traMADol (ULTRAM) 50 MG tablet Take 1 tablet (50 mg total) by mouth every 6 (six) hours as needed. 11/05/19 11/04/20  Enid Derry, PA-C    Allergies Percocet [oxycodone-acetaminophen]  No family history on file.  Social History Social History    Tobacco Use   Smoking status: Never   Smokeless tobacco: Never  Vaping Use   Vaping Use: Never used  Substance Use Topics   Alcohol use: No   Drug use: Never     Review of Systems  Constitutional: No fever/chills Eyes:  No discharge ENT: No upper respiratory complaints. Respiratory: no cough. No SOB/ use of accessory muscles to breath Gastrointestinal:  Patient has abdominal pain.  Musculoskeletal: Negative for musculoskeletal pain. Skin: Negative for rash, abrasions, lacerations, ecchymosis.    ____________________________________________   PHYSICAL EXAM:  VITAL SIGNS: ED Triage Vitals  Enc Vitals Group     BP 10/20/20 1647 125/76     Pulse Rate 10/20/20 1647 83     Resp 10/20/20 1647 20     Temp 10/20/20 1647 98.4 F (36.9 C)     Temp Source 10/20/20 1647 Oral     SpO2 10/20/20 1647 100 %     Weight 10/20/20 1651 160 lb (72.6 kg)     Height 10/20/20 1651 5\' 2"  (1.575 m)     Head Circumference --      Peak Flow --      Pain Score 10/20/20 1651 8     Pain Loc --      Pain Edu? --      Excl. in GC? --      Constitutional: Alert and oriented. Well appearing and in no acute distress. Eyes: Conjunctivae are normal. PERRL. EOMI. Head: Atraumatic. ENT:      Nose: No congestion/rhinnorhea.      Mouth/Throat: Mucous membranes are moist.  Neck: No stridor.  No cervical spine tenderness to palpation. Cardiovascular: Normal rate, regular rhythm. Normal S1 and S2.  Good peripheral circulation. Respiratory: Normal respiratory effort without tachypnea or retractions. Lungs CTAB. Good air entry to the bases with no decreased or absent breath sounds Gastrointestinal: Patient has right upper quadrant tenderness to palpation with guarding.  Bowel sounds active in all 4 quadrants.  Abdominal striae present. Musculoskeletal: Full range of motion to all extremities. No obvious deformities noted Neurologic:  Normal for age. No gross focal neurologic deficits are appreciated.   Skin:  Skin is warm, dry and intact. No rash noted. Psychiatric: Mood and affect are normal for age. Speech and behavior are normal.   ____________________________________________   LABS (all labs ordered are listed, but only abnormal results are displayed)  Labs Reviewed  BASIC METABOLIC PANEL - Abnormal; Notable for the following components:      Result Value   Potassium 3.3 (*)    Glucose, Bld 112 (*)    Calcium 8.6 (*)    Anion gap 4 (*)    All other components within normal limits  CBC - Abnormal; Notable for the following components:   WBC 12.3 (*)    All other components within normal limits  URINALYSIS, COMPLETE (UACMP) WITH MICROSCOPIC  HEPATIC FUNCTION PANEL  LIPASE, BLOOD  POC URINE PREG, ED  TROPONIN I (HIGH SENSITIVITY)   ____________________________________________  EKG   ____________________________________________  RADIOLOGY Geraldo Pitter, personally viewed and evaluated these images (plain radiographs) as part of my medical decision making, as well as reviewing the written report by the radiologist.  DG Chest 2 View  Result Date: 10/20/2020 CLINICAL DATA:  Epigastric pain. Left arm and shoulder pain. Vomiting. EXAM: CHEST - 2 VIEW COMPARISON:  03/31/2018 FINDINGS: The cardiomediastinal contours are normal. Mild patchy bibasilar opacities. Pulmonary vasculature is normal. No consolidation, pleural effusion, or pneumothorax. No acute osseous abnormalities are seen. IMPRESSION: Mild patchy bibasilar opacities, favor atelectasis, although atypical pneumonia could have a similar appearance. Electronically Signed   By: Narda Rutherford M.D.   On: 10/20/2020 17:44    ____________________________________________    PROCEDURES  Procedure(s) performed:     Procedures     Medications  sodium chloride 0.9 % bolus 1,000 mL (has no administration in time range)  morphine 4 MG/ML injection 4 mg (has no administration in time range)  ondansetron (ZOFRAN)  injection 4 mg (has no administration in time range)     ____________________________________________   INITIAL IMPRESSION / ASSESSMENT AND PLAN / ED COURSE  Pertinent labs & imaging results that were available during my care of the patient were reviewed by me and considered in my medical decision making (see chart for details).      Assessment and plan: Abdominal pain:  40 year old female presents to the emergency department with right upper quadrant abdominal pain that started this morning.  Patient had mild leukocytosis on CBC.  Patient had mild elevation on hepatic function panel but was otherwise reassuring.  Lipase within reference range.  Urinalysis only showed rare bacteria but was not suggestive of UTI.  Troponin was within reference range.  EKG indicated normal sinus rhythm without ST segment elevation or other apparent arrhythmia.  Right upper quadrant ultrasound not suggestive of cholecystitis.  Patient was given GI cocktail and she reported that her pain and improved.  Patient was discharged with omeprazole and Pepcid.  Patient states that she has taken omeprazole in the past and it has helped with her gastritis  symptoms.  Return precautions were given to return with new or worsening symptoms.  All patient questions were answered.  ____________________________________________  FINAL CLINICAL IMPRESSION(S) / ED DIAGNOSES  Final diagnoses:  Abdominal pain      NEW MEDICATIONS STARTED DURING THIS VISIT:  ED Discharge Orders     None           This chart was dictated using voice recognition software/Dragon. Despite best efforts to proofread, errors can occur which can change the meaning. Any change was purely unintentional.     Orvil Feil, PA-C 10/20/20 2328    Sharyn Creamer, MD 10/21/20 [phone]

## 2020-10-20 NOTE — Discharge Instructions (Signed)
You can take 40 mg of omeprazole once daily for the next 8 weeks. You can take 40 mg of Pepcid once daily for the next 8 weeks.

## 2020-12-06 ENCOUNTER — Emergency Department: Admission: EM | Admit: 2020-12-06 | Discharge: 2020-12-06 | Discharge status: Against Medical Advice | Payer: Self-pay

## 2022-08-18 ENCOUNTER — Emergency Department
Admission: EM | Admit: 2022-08-18 | Discharge: 2022-08-18 | Disposition: A | Discharge status: Home / Self Care | Payer: Self-pay | Attending: Emergency Medicine | Admitting: Emergency Medicine

## 2022-08-18 ENCOUNTER — Other Ambulatory Visit: Payer: Self-pay

## 2022-08-18 DIAGNOSIS — B349 Viral infection, unspecified: Secondary | ICD-10-CM | POA: Insufficient documentation

## 2022-08-18 DIAGNOSIS — Z1152 Encounter for screening for COVID-19: Secondary | ICD-10-CM | POA: Insufficient documentation

## 2022-08-18 LAB — CBC WITH DIFFERENTIAL/PLATELET
Abs Immature Granulocytes: 0.01 10*3/uL (ref 0.00–0.07)
Basophils Absolute: 0 10*3/uL (ref 0.0–0.1)
Basophils Relative: 1 %
Eosinophils Absolute: 0 10*3/uL (ref 0.0–0.5)
Eosinophils Relative: 0 %
HCT: 41.1 % (ref 36.0–46.0)
Hemoglobin: 13.3 g/dL (ref 12.0–15.0)
Immature Granulocytes: 0 %
Lymphocytes Relative: 61 %
Lymphs Abs: 2.4 10*3/uL (ref 0.7–4.0)
MCH: 28.7 pg (ref 26.0–34.0)
MCHC: 32.4 g/dL (ref 30.0–36.0)
MCV: 88.8 fL (ref 80.0–100.0)
Monocytes Absolute: 0.5 10*3/uL (ref 0.1–1.0)
Monocytes Relative: 11 %
Neutro Abs: 1.1 10*3/uL — ABNORMAL LOW (ref 1.7–7.7)
Neutrophils Relative %: 27 %
Platelets: 267 10*3/uL (ref 150–400)
RBC: 4.63 MIL/uL (ref 3.87–5.11)
RDW: 14.7 % (ref 11.5–15.5)
Smear Review: NORMAL
WBC: 3.9 10*3/uL — ABNORMAL LOW (ref 4.0–10.5)
nRBC: 0 % (ref 0.0–0.2)

## 2022-08-18 LAB — POC URINE PREG, ED: Preg Test, Ur: NEGATIVE

## 2022-08-18 LAB — URINALYSIS, ROUTINE W REFLEX MICROSCOPIC
Bilirubin Urine: NEGATIVE
Glucose, UA: NEGATIVE mg/dL
Hgb urine dipstick: NEGATIVE
Ketones, ur: NEGATIVE mg/dL
Leukocytes,Ua: NEGATIVE
Nitrite: NEGATIVE
Protein, ur: 30 mg/dL — AB
Specific Gravity, Urine: 1.028 (ref 1.005–1.030)
pH: 5 (ref 5.0–8.0)

## 2022-08-18 LAB — COMPREHENSIVE METABOLIC PANEL
ALT: 20 U/L (ref 0–44)
AST: 22 U/L (ref 15–41)
Albumin: 3.9 g/dL (ref 3.5–5.0)
Alkaline Phosphatase: 83 U/L (ref 38–126)
Anion gap: 7 (ref 5–15)
BUN: 15 mg/dL (ref 6–20)
CO2: 23 mmol/L (ref 22–32)
Calcium: 8.4 mg/dL — ABNORMAL LOW (ref 8.9–10.3)
Chloride: 110 mmol/L (ref 98–111)
Creatinine, Ser: 0.73 mg/dL (ref 0.44–1.00)
GFR, Estimated: >60 mL/min (ref >60)
Glucose, Bld: 73 mg/dL (ref 70–99)
Potassium: 3.8 mmol/L (ref 3.5–5.1)
Sodium: 140 mmol/L (ref 135–145)
Total Bilirubin: 0.4 mg/dL (ref 0.3–1.2)
Total Protein: 7.6 g/dL (ref 6.5–8.1)

## 2022-08-18 LAB — LIPASE, BLOOD: Lipase: 40 U/L (ref 11–51)

## 2022-08-18 LAB — SARS CORONAVIRUS 2 BY RT PCR: SARS Coronavirus 2 by RT PCR: NEGATIVE

## 2022-08-18 MED ORDER — SODIUM CHLORIDE 0.9 % IV BOLUS
1000.0000 mL | Freq: Once | INTRAVENOUS | Status: AC
  Administered 2022-08-18: 1000 mL via INTRAVENOUS

## 2022-08-18 MED ORDER — ACETAMINOPHEN 325 MG PO TABS
650.0000 mg | ORAL_TABLET | Freq: Once | ORAL | Status: AC
  Administered 2022-08-18: 650 mg via ORAL
  Filled 2022-08-18: qty 2

## 2022-08-18 MED ORDER — IBUPROFEN 600 MG PO TABS
600.0000 mg | ORAL_TABLET | Freq: Once | ORAL | Status: AC
  Administered 2022-08-18: 600 mg via ORAL
  Filled 2022-08-18: qty 1

## 2022-08-18 NOTE — Discharge Instructions (Addendum)
I believe your illness today is caused by a virus.  You can take 650 mg of Tylenol and 600 mg of ibuprofen 6 hours as needed for pain.    If you are not improving and your symptoms are getting worse you can return to the ED or be seen at urgent care.

## 2022-08-18 NOTE — ED Triage Notes (Signed)
Pt to ed from home via POV for fever, chills and dark colored urine. Pt is caox4, in no acute distress and ambulatory in triage. Pt advised these symptoms started 3 days ago.

## 2022-08-18 NOTE — ED Provider Notes (Signed)
[facility] Provider Note    Event Date/Time   First MD Initiated Contact with Patient 08/18/22 1553     (approximate)   History   Fever   HPI  Gabriela Carter is a 42 y.o. female PMH of MDD who presents for evaluation of fever, chills, headache and bodyaches x 3 days.  Patient also endorses some nausea and diarrhea that has since resolved.  Patient denies urinary symptoms but states she had some dark yellow urine.    Physical Exam   Triage Vital Signs: ED Triage Vitals [08/18/22 1514]  Enc Vitals Group     BP 112/74     Pulse Rate 76     Resp 16     Temp 97.9 F (36.6 C)     Temp Source Oral     SpO2 98 %     Weight 158 lb 11.7 oz (72 kg)     Height 5\' 2"  (1.575 m)     Head Circumference      Peak Flow      Pain Score 0     Pain Loc      Pain Edu?      Excl. in GC?     Most recent vital signs: Vitals:   08/18/22 1914 08/18/22 1915  BP: 106/70   Pulse: (!) 58   Resp: 16   Temp:    SpO2: 99% 99%     General: Awake, no distress.  CV:  Good peripheral perfusion.  RRR. Resp:  Normal effort.  CTAB. Abd:  No distention.  Soft, nontender, bowel sounds in all 4 quadrants.    ED Results / Procedures / Treatments   Labs (all labs ordered are listed, but only abnormal results are displayed) Labs Reviewed  URINALYSIS, ROUTINE W REFLEX MICROSCOPIC - Abnormal; Notable for the following components:      Result Value   Color, Urine YELLOW (*)    APPearance HAZY (*)    Protein, ur 30 (*)    Bacteria, UA RARE (*)    All other components within normal limits  CBC WITH DIFFERENTIAL/PLATELET - Abnormal; Notable for the following components:   WBC 3.9 (*)    Neutro Abs 1.1 (*)    All other components within normal limits  COMPREHENSIVE METABOLIC PANEL - Abnormal; Notable for the following components:   Calcium 8.4 (*)    All other components within normal limits  SARS CORONAVIRUS 2 BY RT PCR  LIPASE, BLOOD  POC URINE PREG, ED     PROCEDURES:  Critical Care performed: No  Procedures   MEDICATIONS ORDERED IN ED: Medications  sodium chloride 0.9 % bolus 1,000 mL (1,000 mLs Intravenous New Bag/Given 08/18/22 1827)  ibuprofen (ADVIL) tablet 600 mg (600 mg Oral Given 08/18/22 1832)  acetaminophen (TYLENOL) tablet 650 mg (650 mg Oral Given 08/18/22 1832)     IMPRESSION / MDM / ASSESSMENT AND PLAN / ED COURSE  I reviewed the triage vital signs and the nursing notes.                             42 year old female presents for evaluation of fever, headache and bodyaches.  Vital signs stable in triage.  On exam patient is in mild distress.  Differential diagnosis includes, but is not limited to, UTI, headache, viral illness.  Patient's presentation is most consistent with acute complicated illness / injury requiring diagnostic workup.  COVID test was negative.  CBC, CMP and lipase all WNL.  UA did not indicate a UTI.  Patient was given Tylenol and ibuprofen for her headache.  She also had some IV fluids.  She reported an improvement in her symptoms after the medication and fluids.  Given patient's reassuring labs I feel she is appropriate for outpatient management.  I advised her on OTC pain management with Tylenol and ibuprofen.  She is aware that she can return to the ED if she has any new or worsening symptoms.  Patient voiced understanding, was agreeable to plan and was stable at discharge.    FINAL CLINICAL IMPRESSION(S) / ED DIAGNOSES   Final diagnoses:  Viral illness     Rx / DC Orders   ED Discharge Orders     None        Note:  This document was prepared using Dragon voice recognition software and may include unintentional dictation errors.   Cameron Ali, PA-C 08/18/22 2037    Merwyn Katos, MD 08/18/22 [phone]

## 2023-01-23 IMAGING — US US ABDOMEN LIMITED
1 series · 14 of 25 positions shown · non-contrast
Comparison: CT 01/24/2020.  Abdominal ultrasound 07/24/2017

CLINICAL DATA: Abdominal pain for 1 day.  Vomiting.

EXAM:
ULTRASOUND ABDOMEN LIMITED RIGHT UPPER QUADRANT

[Series 1: us abdomen limited ruq (liver/gb) · 14 of 57 slices shown]
[im 1/57]
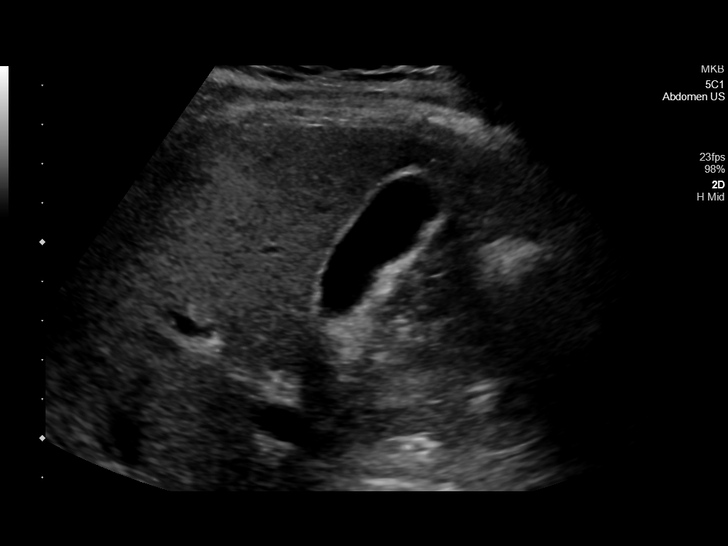
[im 5/57]
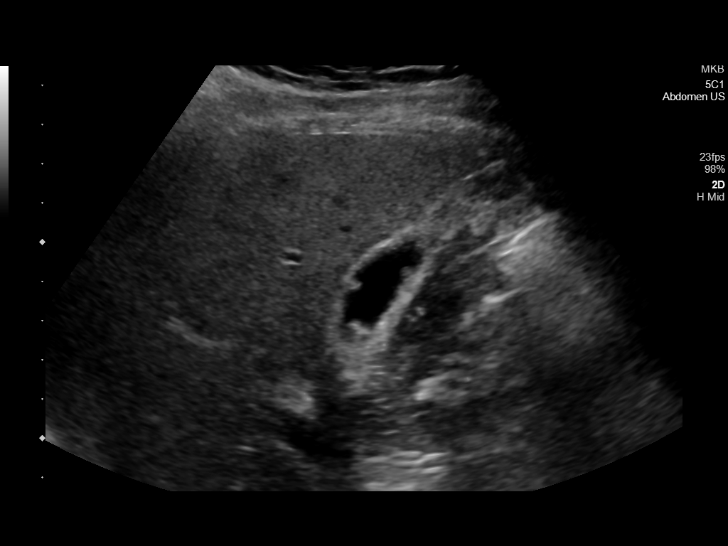
[im 10/57]
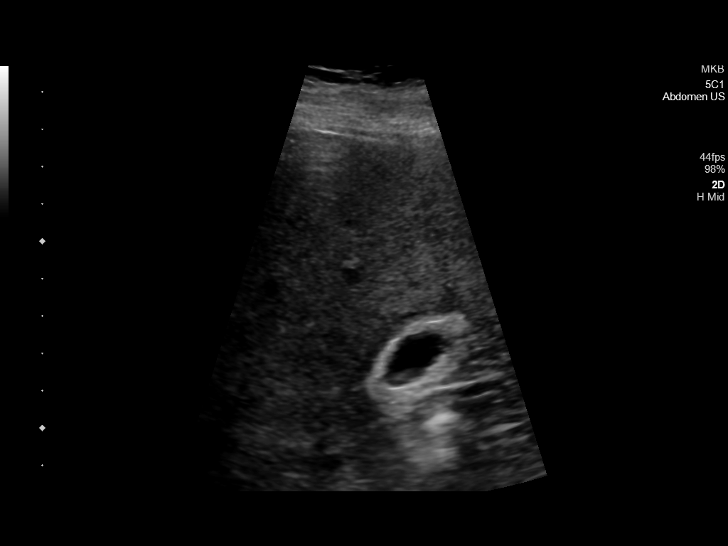
[im 15/57]
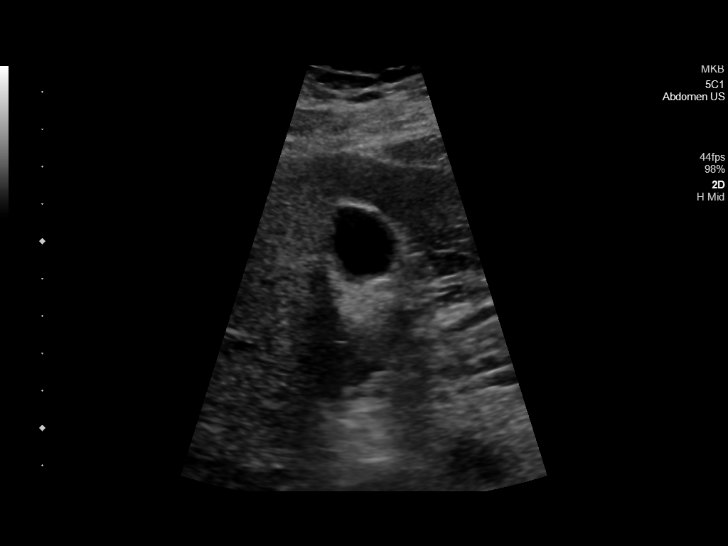
[im 19/57]
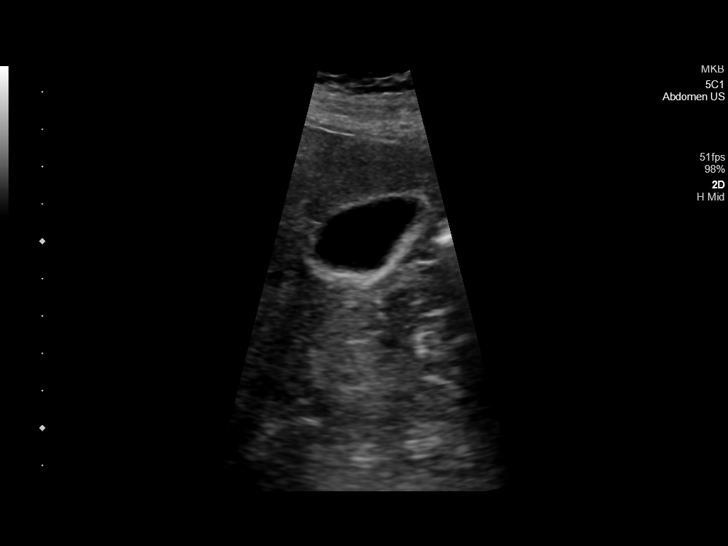
[im 22/57]
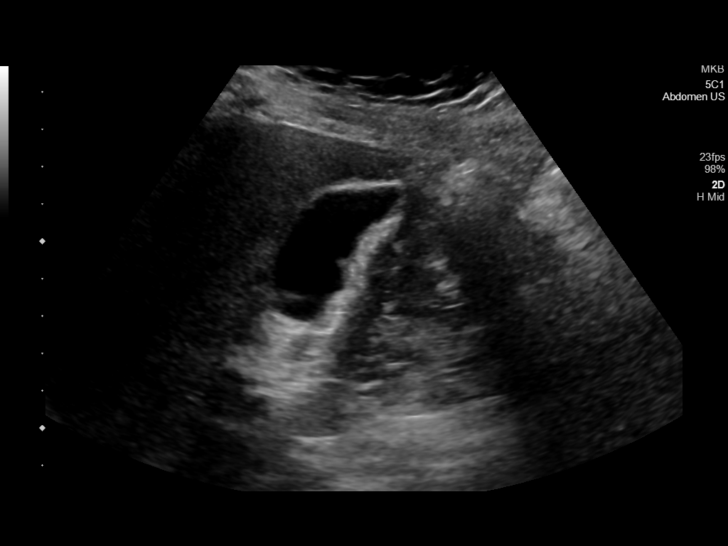
[im 26/57]
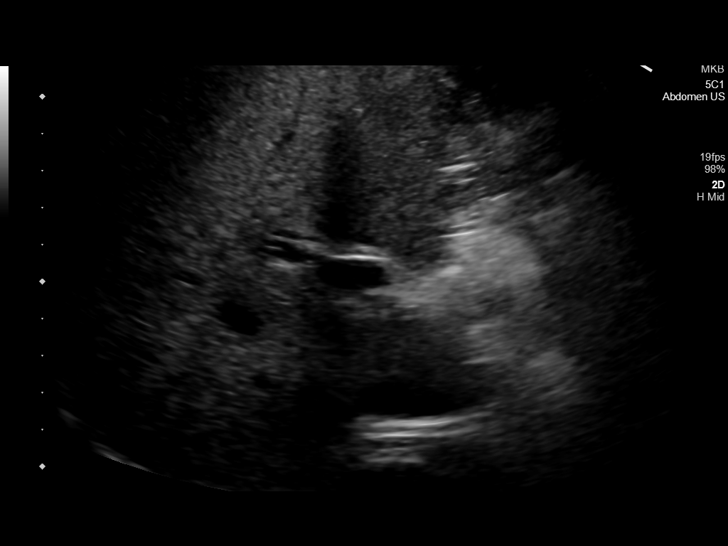
[im 31/57]
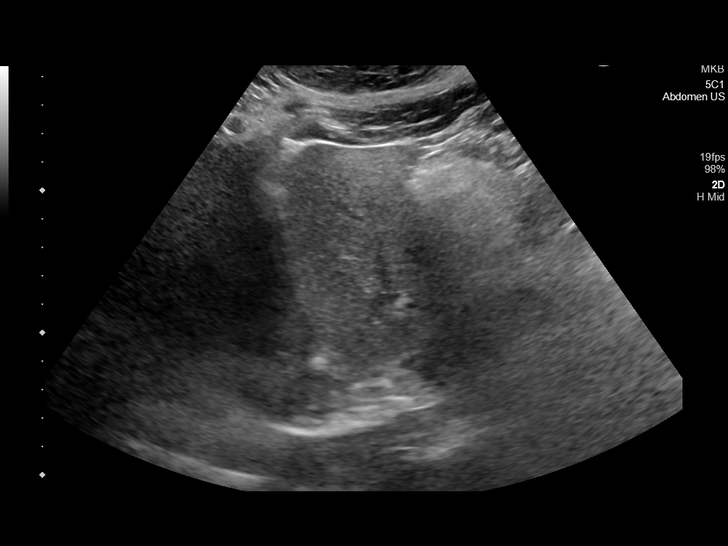
[im 36/57]
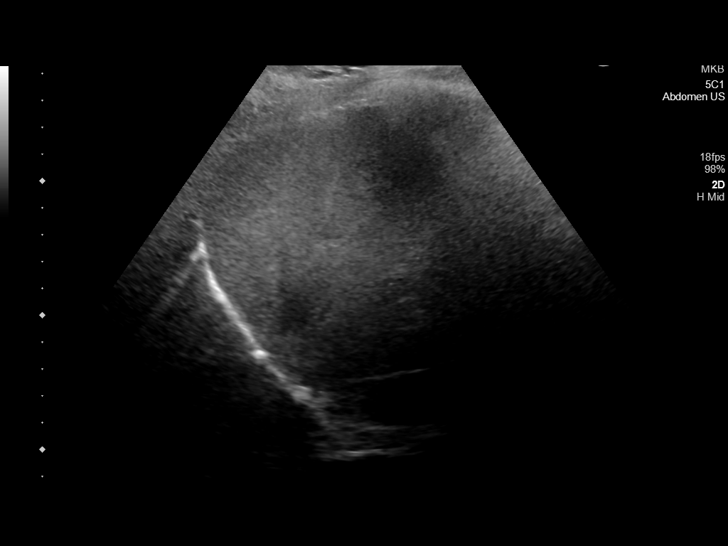
[im 38/57]
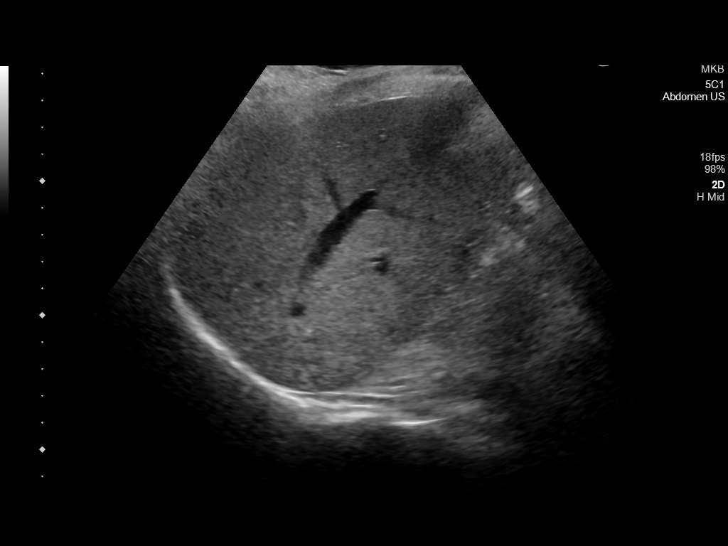
[im 43/57]
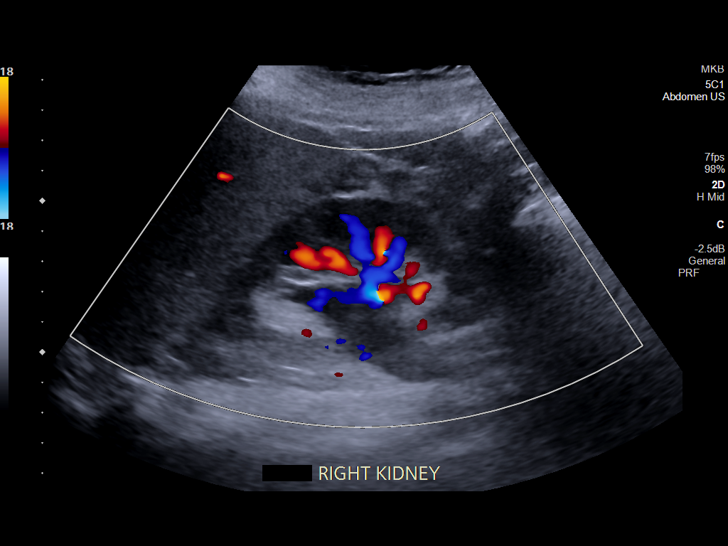
[im 47/57]
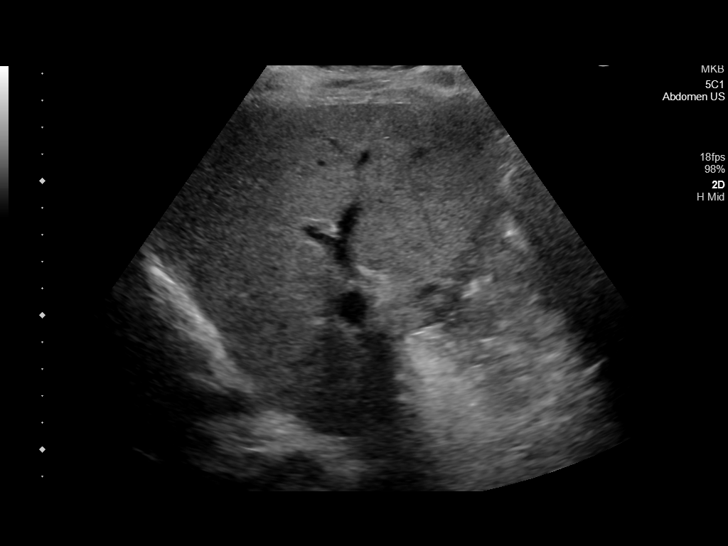
[im 52/57]
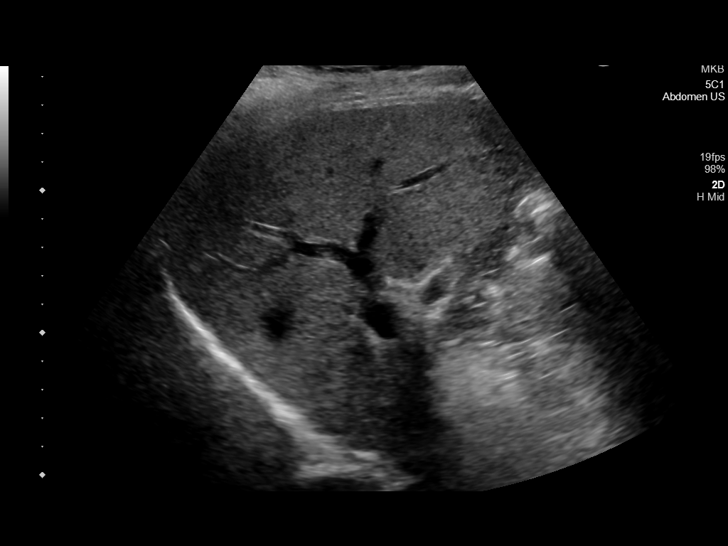
[im 57/57]
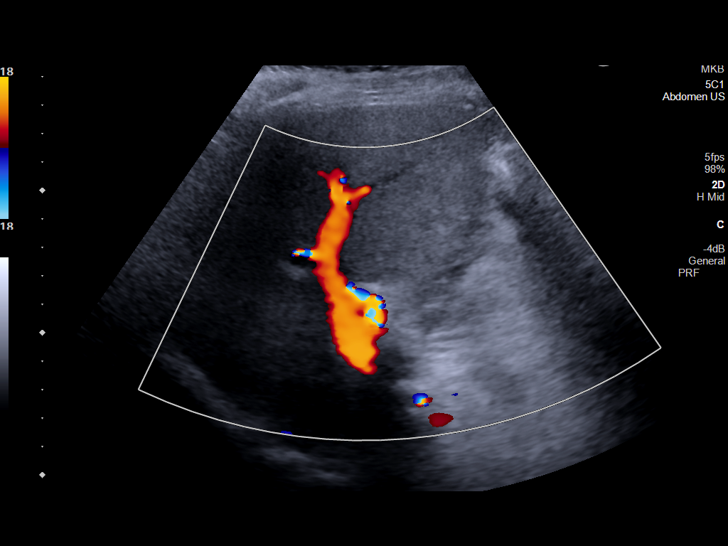

[14 of 25 positions shown; findings below may reference images not displayed]

FINDINGS: Gallbladder:

Physiologically distended. Single mobile gallstone measures 7 mm. At
least 2 small gallbladder polyps, largest measuring 4 mm. No
gallbladder wall thickening. No pericholecystic fluid. No
sonographic Murphy sign noted by sonographer.

Common bile duct:

Diameter: 3 mm, normal.

Liver:

No focal lesion identified. Mildly increased in parenchymal
echogenicity. Portal vein is patent on color Doppler imaging with
normal direction of blood flow towards the liver.

Other: Dilatation of the right renal pelvis which was seen on prior
exam.
IMPRESSION: 1. Single mobile gallstone.  No evidence of acute cholecystitis.
2. Small gallbladder polyps largest measuring 4 mm, unchanged from
prior exam. No dedicated further follow-up is needed due to size.
3. Dilatation of the renal pelvis and right renal collecting system
is not significantly changed from prior exam, and favored to be
chronic.
4. Mild increased hepatic parenchymal echogenicity suggesting
steatosis.

## 2023-09-13 HISTORY — PX: BREAST ENHANCEMENT SURGERY: SHX7

## 2023-12-03 ENCOUNTER — Other Ambulatory Visit: Payer: Self-pay

## 2023-12-03 ENCOUNTER — Emergency Department
Admission: EM | Admit: 2023-12-03 | Discharge: 2023-12-03 | Disposition: A | Discharge status: Home / Self Care | Payer: Self-pay | Attending: Emergency Medicine | Admitting: Emergency Medicine

## 2023-12-03 ENCOUNTER — Emergency Department: Payer: Self-pay

## 2023-12-03 ENCOUNTER — Encounter: Payer: Self-pay | Admitting: Emergency Medicine

## 2023-12-03 DIAGNOSIS — R1010 Upper abdominal pain, unspecified: Secondary | ICD-10-CM

## 2023-12-03 DIAGNOSIS — K8020 Calculus of gallbladder without cholecystitis without obstruction: Secondary | ICD-10-CM | POA: Insufficient documentation

## 2023-12-03 LAB — COMPREHENSIVE METABOLIC PANEL WITH GFR
ALT: 14 U/L (ref 0–44)
AST: 18 U/L (ref 15–41)
Albumin: 3.9 g/dL (ref 3.5–5.0)
Alkaline Phosphatase: 79 U/L (ref 38–126)
Anion gap: 12 (ref 5–15)
BUN: 17 mg/dL (ref 6–20)
CO2: 22 mmol/L (ref 22–32)
Calcium: 8.9 mg/dL (ref 8.9–10.3)
Chloride: 105 mmol/L (ref 98–111)
Creatinine, Ser: 0.78 mg/dL (ref 0.44–1.00)
GFR, Estimated: >60 mL/min (ref >60)
Glucose, Bld: 100 mg/dL — ABNORMAL HIGH (ref 70–99)
Potassium: 3.9 mmol/L (ref 3.5–5.1)
Sodium: 139 mmol/L (ref 135–145)
Total Bilirubin: 0.3 mg/dL (ref 0.0–1.2)
Total Protein: 7.4 g/dL (ref 6.5–8.1)

## 2023-12-03 LAB — CBC
HCT: 39.9 % (ref 36.0–46.0)
Hemoglobin: 13.1 g/dL (ref 12.0–15.0)
MCH: 28.6 pg (ref 26.0–34.0)
MCHC: 32.8 g/dL (ref 30.0–36.0)
MCV: 87.1 fL (ref 80.0–100.0)
Platelets: 251 K/uL (ref 150–400)
RBC: 4.58 MIL/uL (ref 3.87–5.11)
RDW: 13.6 % (ref 11.5–15.5)
WBC: 8.6 K/uL (ref 4.0–10.5)
nRBC: 0 % (ref 0.0–0.2)

## 2023-12-03 LAB — POC URINE PREG, ED: Preg Test, Ur: NEGATIVE

## 2023-12-03 LAB — LIPASE, BLOOD: Lipase: 33 U/L (ref 11–51)

## 2023-12-03 LAB — URINALYSIS, ROUTINE W REFLEX MICROSCOPIC
Bilirubin Urine: NEGATIVE
Glucose, UA: NEGATIVE mg/dL
Hgb urine dipstick: NEGATIVE
Ketones, ur: NEGATIVE mg/dL
Leukocytes,Ua: NEGATIVE
Nitrite: NEGATIVE
Protein, ur: NEGATIVE mg/dL
Specific Gravity, Urine: 1.005 (ref 1.005–1.030)
pH: 6 (ref 5.0–8.0)

## 2023-12-03 MED ORDER — ONDANSETRON HCL 4 MG/2ML IJ SOLN
4.0000 mg | Freq: Once | INTRAMUSCULAR | Status: AC
  Administered 2023-12-03: 4 mg via INTRAVENOUS
  Filled 2023-12-03: qty 2

## 2023-12-03 MED ORDER — SODIUM CHLORIDE 0.9 % IV BOLUS (SEPSIS)
1000.0000 mL | Freq: Once | INTRAVENOUS | Status: AC
  Administered 2023-12-03: 1000 mL via INTRAVENOUS

## 2023-12-03 MED ORDER — MORPHINE SULFATE (PF) 4 MG/ML IV SOLN
4.0000 mg | Freq: Once | INTRAVENOUS | Status: AC
  Administered 2023-12-03: 4 mg via INTRAVENOUS
  Filled 2023-12-03: qty 1

## 2023-12-03 MED ORDER — TRAMADOL HCL 50 MG PO TABS: 50.0000 mg | ORAL_TABLET | Freq: Three times a day (TID) | ORAL | 0 refills | Status: AC | PRN

## 2023-12-03 MED ORDER — ONDANSETRON 4 MG PO TBDP: 4.0000 mg | ORAL_TABLET | Freq: Four times a day (QID) | ORAL | 0 refills | Status: AC | PRN

## 2023-12-03 NOTE — Discharge Instructions (Signed)
 You may alternate over the counter Tylenol 1000 mg every 6 hours as needed for pain, fever and Ibuprofen 800 mg every 6-8 hours as needed for pain, fever.  Please take Ibuprofen with food.  Do not take more than 4000 mg of Tylenol (acetaminophen) in a 24 hour period.  Puede alternar Tylenol de venta libre de 1000 mg cada 6 horas, segn sea necesario, para el dolor y la fiebre, e ibuprofeno de 800 mg cada 6-8 horas, segn sea necesario, para el dolor y Insurance account manager. Tome el ibuprofeno con alimentos. No tome ms de 4000 mg de Tylenol (paracetamol) en un perodo de 24 horas.  You are being provided a prescription for opiates (also known as narcotics) for pain control.  Opiates can be addictive and should only be used when absolutely necessary for pain control when other alternatives do not work.  We recommend you only use them for the recommended amount of time and only as prescribed.  Please do not take with other sedative medications or alcohol.  Please do not drive, operate machinery, make important decisions while taking opiates.  Please note that these medications can be addictive and have high abuse potential.  Patients can become addicted to narcotics after only taking them for a few days.  Please keep these medications locked away from children, teenagers or any family members with history of substance abuse.  Narcotic pain medicine may also make you constipated.  You may use over-the-counter medications such as MiraLAX, Colace to prevent constipation.  If you become constipated, you may use over-the-counter enemas as needed.  Itching and nausea are also common side effects of narcotic pain medication.  If you develop uncontrolled vomiting or a rash, please stop these medications and seek medical care.  Se le ha recetado opiceos (tambin conocidos como narcticos) para el control del dolor. Los opiceos pueden ser [employer] y solo deben usarse cuando sea absolutamente necesario para controlar el dolor  cuando otras alternativas no funcionen. Recomendamos usarlos solo durante el tiempo recomendado y segn lo prescrito. No los tome con otros sedantes ni alcohol. No conduzca, opere maquinaria ni tome decisiones importantes mientras est tomando opiceos. Tenga en cuenta que estos medicamentos pueden ser adictivos y tienen un alto potencial de abuso. Los Therapist, nutritional adiccin a los narcticos despus de tomarlos solo [employer]. Mantenga estos medicamentos fuera del alcance de nios, adolescentes o familiares con antecedentes de abuso de sustancias. Los analgsicos narcticos tambin pueden causar estreimiento. Puede usar [employer] de venta libre como MiraLAX o Colace para prevenir el estreimiento. Si sufre estreimiento, puede usar enemas de venta libre segn sea necesario. La picazn y las nuseas tambin son efectos secundarios comunes de los analgsicos narcticos. Si presenta vmitos incontrolables o sarpullido, suspenda el uso de estos medicamentos y busque atencin mdica.

## 2023-12-03 NOTE — ED Provider Notes (Signed)
 [facility] Provider Note    Event Date/Time   First MD Initiated Contact with Patient 12/03/23 0215     (approximate)   History   Abdominal Pain   HPI  Gabriela Carter is a 43 y.o. female with history of prior appendectomy who presents to the emergency department complaints of right upper quadrant abdominal pain with nausea.  No vomiting, diarrhea, fevers, dysuria or hematuria, vaginal bleeding or discharge.   History provided by patient, daughter.    History reviewed. No pertinent past medical history.  Past Surgical History:  Procedure Laterality Date   APPENDECTOMY     BREAST ENHANCEMENT SURGERY Bilateral 09/2023   surgery in [place]    MEDICATIONS:  Prior to Admission medications   Medication Sig Start Date End Date Taking? Authorizing Provider  famotidine  (PEPCID ) 40 MG tablet Take 1 tablet (40 mg total) by mouth every evening. 10/20/20 10/20/21  Woods, Jaclyn M, PA-C  omeprazole  (PRILOSEC  OTC) 20 MG tablet Take 2 tablets (40 mg total) by mouth daily. 10/20/20 12/15/20  Woods, Jaclyn M, PA-C  pantoprazole  (PROTONIX ) 40 MG tablet Take 1 tablet (40 mg total) by mouth daily. 01/24/20 01/23/21  Bradler, Evan K, MD  sucralfate  (CARAFATE ) 1 g tablet Take 1 tablet (1 g total) by mouth 4 (four) times daily. 01/24/20 01/23/21  Jossie Artist POUR, MD    Physical Exam   Triage Vital Signs: ED Triage Vitals  Encounter Vitals Group     BP 12/03/23 0039 (!) 115/96     Girls Systolic BP Percentile --      Girls Diastolic BP Percentile --      Boys Systolic BP Percentile --      Boys Diastolic BP Percentile --      Pulse Rate 12/03/23 0039 77     Resp 12/03/23 0039 18     Temp 12/03/23 0039 97.8 F (36.6 C)     Temp Source 12/03/23 0039 Oral     SpO2 12/03/23 0039 100 %     Weight --      Height --      Head Circumference --      Peak Flow --      Pain Score 12/03/23 0036 6     Pain Loc --      Pain Education --      Exclude from Growth Chart --      Most recent vital signs: Vitals:   12/03/23 0404 12/03/23 0430  BP: 119/81 113/84  Pulse: 72 69  Resp: 16 16  Temp:    SpO2: 100% 100%    CONSTITUTIONAL: Alert, responds appropriately to questions. Well-appearing; well-nourished HEAD: Normocephalic, atraumatic EYES: Conjunctivae clear, pupils appear equal, sclera nonicteric ENT: normal nose; moist mucous membranes NECK: Supple, normal ROM CARD: RRR; S1 and S2 appreciated RESP: Normal chest excursion without splinting or tachypnea; breath sounds clear and equal bilaterally; no wheezes, no rhonchi, no rales, no hypoxia or respiratory distress, speaking full sentences ABD/GI: Non-distended; soft, tender in the right upper abdomen without guarding or rebound BACK: The back appears normal EXT: Normal ROM in all joints; no deformity noted, no edema SKIN: Normal color for age and race; warm; no rash on exposed skin NEURO: Moves all extremities equally, normal speech PSYCH: The patient's mood and manner are appropriate.   ED Results / Procedures / Treatments   LABS: (all labs ordered are listed, but only abnormal results are displayed) Labs Reviewed  COMPREHENSIVE METABOLIC PANEL WITH GFR - Abnormal; Notable  for the following components:      Result Value   Glucose, Bld 100 (*)    All other components within normal limits  URINALYSIS, ROUTINE W REFLEX MICROSCOPIC - Abnormal; Notable for the following components:   Color, Urine STRAW (*)    APPearance CLEAR (*)    All other components within normal limits  LIPASE, BLOOD  CBC  POC URINE PREG, ED     EKG:  EKG Interpretation Date/Time:  Tuesday December 03 2023 00:43:11 EDT Ventricular Rate:  86 PR Interval:  152 QRS Duration:  80 QT Interval:  350 QTC Calculation: 418 R Axis:   79  Text Interpretation: Normal sinus rhythm T wave abnormality, consider anterior ischemia Abnormal ECG When compared with ECG of 20-Oct-2020 16:56, Inverted T waves have replaced nonspecific  T wave abnormality in Anterior leads Confirmed by Neomi Neptune [phone]) on 12/03/2023 2:16:11 AM         RADIOLOGY: My personal review and interpretation of imaging: Gallstones.  I have personally reviewed all radiology reports.   US  ABDOMEN LIMITED RUQ (LIVER/GB) Result Date: 12/03/2023 EXAM: Right Upper Quadrant Abdominal Ultrasound 12/03/2023 03:57:00 AM TECHNIQUE: Real-time ultrasonography of the right upper quadrant of the abdomen was performed. COMPARISON: Previous abdomen ultrasound 10/20/2020. CT abdomen and pelvis 01/24/2020. CLINICAL HISTORY: 43 year old female with RUQ abdominal pain. FINDINGS: LIVER: The liver demonstrates normal echogenicity. No intrahepatic biliary ductal dilatation. No evidence of mass. BILIARY SYSTEM: Shadowing gallstones, individually up to 1.5 cm in diameter. Gallbladder wall thickness remains normal. No pericholecystic fluid. Common bile duct is diminutive at 1 to 2 mm. Negative sonographic Murphy's sign. RIGHT KIDNEY: Stable visible right kidney, right extrarenal pelvis suspected of a normal variant. OTHER: No right upper quadrant ascites. IMPRESSION: 1. Cholelithiasis without strong evidence of acute cholecystitis. Electronically signed by: Helayne Hurst MD 12/03/2023 04:08 AM EDT RP Workstation: [id number]     PROCEDURES:  Critical Care performed: No    Procedures    IMPRESSION / MDM / ASSESSMENT AND PLAN / ED COURSE  I reviewed the triage vital signs and the nursing notes.    Patient here with upper abdominal pain, nausea.     DIFFERENTIAL DIAGNOSIS (includes but not limited to):   Cholelithiasis, cholecystitis, cholangitis, pancreatitis, gastritis, GERD, peptic ulcer disease, H. pylori, doubt ACS   Patient's presentation is most consistent with acute presentation with potential threat to life or bodily function.   PLAN: Will obtain labs, urine, right upper quadrant ultrasound.  Will give IV fluids, pain and nausea  medicine.   MEDICATIONS GIVEN IN ED: Medications  ondansetron  (ZOFRAN ) injection 4 mg (4 mg Intravenous Given 12/03/23 0234)  morphine  (PF) 4 MG/ML injection 4 mg (4 mg Intravenous Given 12/03/23 0234)  sodium chloride  0.9 % bolus 1,000 mL (0 mLs Intravenous Stopped 12/03/23 0316)     ED COURSE: Labs show no leukocytosis.  Normal LFTs, lipase.  Pregnancy test negative.  Ultrasound reviewed and interpreted by myself and the radiologist and shows gallstones without cholecystitis.  Patient reports feeling better.  Will discharge with pain and nausea medicine, instructions for low-fat diet, general surgery follow-up information.   At this time, I do not feel there is any life-threatening condition present. I reviewed all nursing notes, vitals, pertinent previous records.  All lab and urine results, EKGs, imaging ordered have been independently reviewed and interpreted by myself.  I reviewed all available radiology reports from any imaging ordered this visit.  Based on my assessment, I feel the patient is safe to be  discharged home without further emergent workup and can continue workup as an outpatient as needed. Discussed all findings, treatment plan as well as usual and customary return precautions.  They verbalize understanding and are comfortable with this plan.  Outpatient follow-up has been provided as needed.  All questions have been answered.    CONSULTS:  none   OUTSIDE RECORDS REVIEWED: Reviewed last internal medicine note on 11/30/2022.       FINAL CLINICAL IMPRESSION(S) / ED DIAGNOSES   Final diagnoses:  Upper abdominal pain  Gallstones     Rx / DC Orders   ED Discharge Orders          Ordered    traMADol  (ULTRAM ) 50 MG tablet  Every 8 hours PRN        12/03/23 0434    ondansetron  (ZOFRAN -ODT) 4 MG disintegrating tablet  Every 6 hours PRN        12/03/23 0434             Note:  This document was prepared using Dragon voice recognition software and may  include unintentional dictation errors.   Cionna Collantes, Josette SAILOR, DO 12/03/23 [phone]

## 2023-12-03 NOTE — ED Triage Notes (Signed)
 Pt arrives POV, ambulatory to triage, gait steady, no acute distress noted c/o RUQ pain w/ nausea x 2 days, worse tonight.
